# Patient Record
Sex: Female | Born: 1970 | Race: Black or African American | Hispanic: No | Marital: Single | Smoking: Never smoker
Health system: Southern US, Community
[De-identification: ages and names within clinical notes are randomized; demographics above are authoritative.]

## PROBLEM LIST (undated history)

## (undated) DIAGNOSIS — I509 Heart failure, unspecified: Secondary | ICD-10-CM

## (undated) DIAGNOSIS — G4733 Obstructive sleep apnea (adult) (pediatric): Secondary | ICD-10-CM

## (undated) DIAGNOSIS — I10 Essential (primary) hypertension: Secondary | ICD-10-CM

## (undated) DIAGNOSIS — I2510 Atherosclerotic heart disease of native coronary artery without angina pectoris: Secondary | ICD-10-CM

## (undated) DIAGNOSIS — E785 Hyperlipidemia, unspecified: Secondary | ICD-10-CM

## (undated) DIAGNOSIS — I5030 Unspecified diastolic (congestive) heart failure: Secondary | ICD-10-CM

## (undated) DIAGNOSIS — Z973 Presence of spectacles and contact lenses: Secondary | ICD-10-CM

## (undated) DIAGNOSIS — I700 Atherosclerosis of aorta: Secondary | ICD-10-CM

## (undated) HISTORY — DX: Hyperlipidemia, unspecified: E78.5

## (undated) HISTORY — DX: Essential (primary) hypertension: I10

## (undated) HISTORY — PX: BUNIONECTOMY: SHX129

---
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
## 2007-09-16 ENCOUNTER — Encounter: Admission: RE | Admit: 2007-09-16 | Discharge: 2007-09-16 | Payer: Self-pay | Admitting: Internal Medicine

## 2012-08-16 ENCOUNTER — Encounter: Payer: BC Managed Care – PPO | Admitting: Obstetrics and Gynecology

## 2013-08-20 DIAGNOSIS — E6609 Other obesity due to excess calories: Secondary | ICD-10-CM | POA: Insufficient documentation

## 2013-12-26 ENCOUNTER — Other Ambulatory Visit: Payer: Self-pay | Admitting: Otolaryngology

## 2013-12-26 DIAGNOSIS — H905 Unspecified sensorineural hearing loss: Secondary | ICD-10-CM

## 2013-12-26 DIAGNOSIS — H903 Sensorineural hearing loss, bilateral: Secondary | ICD-10-CM

## 2014-01-04 ENCOUNTER — Ambulatory Visit
Admission: RE | Admit: 2014-01-04 | Discharge: 2014-01-04 | Disposition: A | Discharge status: Home / Self Care | Payer: BC Managed Care – PPO | Source: Ambulatory Visit | Attending: Otolaryngology | Admitting: Otolaryngology

## 2014-01-04 DIAGNOSIS — H905 Unspecified sensorineural hearing loss: Secondary | ICD-10-CM

## 2014-01-04 DIAGNOSIS — H903 Sensorineural hearing loss, bilateral: Secondary | ICD-10-CM

## 2014-01-04 MED ORDER — GADOBENATE DIMEGLUMINE 529 MG/ML IV SOLN
20.0000 mL | Freq: Once | INTRAVENOUS | Status: AC | PRN
  Administered 2014-01-04: 20 mL via INTRAVENOUS

## 2014-04-15 DIAGNOSIS — M2010 Hallux valgus (acquired), unspecified foot: Secondary | ICD-10-CM

## 2014-04-15 DIAGNOSIS — M2040 Other hammer toe(s) (acquired), unspecified foot: Secondary | ICD-10-CM

## 2014-12-06 HISTORY — PX: BUNIONECTOMY: SHX129

## 2016-07-13 ENCOUNTER — Other Ambulatory Visit: Payer: Self-pay | Admitting: Gastroenterology

## 2016-07-13 DIAGNOSIS — R110 Nausea: Secondary | ICD-10-CM

## 2016-08-13 ENCOUNTER — Other Ambulatory Visit (HOSPITAL_COMMUNITY): Payer: BC Managed Care – PPO

## 2016-08-13 ENCOUNTER — Ambulatory Visit (HOSPITAL_COMMUNITY): Payer: BC Managed Care – PPO

## 2017-01-24 ENCOUNTER — Institutional Professional Consult (permissible substitution): Payer: BC Managed Care – PPO | Admitting: Neurology

## 2017-01-24 ENCOUNTER — Telehealth: Payer: Self-pay

## 2017-01-24 NOTE — Telephone Encounter (Signed)
Patient did not show to new sleep consult appt.  

## 2017-01-25 ENCOUNTER — Encounter: Payer: Self-pay | Admitting: Neurology

## 2019-08-16 ENCOUNTER — Ambulatory Visit: Payer: Self-pay | Admitting: Cardiology

## 2019-08-20 ENCOUNTER — Other Ambulatory Visit: Payer: Self-pay

## 2019-08-20 ENCOUNTER — Ambulatory Visit (INDEPENDENT_AMBULATORY_CARE_PROVIDER_SITE_OTHER): Payer: BC Managed Care – PPO | Admitting: Cardiology

## 2019-08-20 ENCOUNTER — Encounter: Payer: Self-pay | Admitting: Cardiology

## 2019-08-20 VITALS — BP 170/105 | HR 86 | Temp 98.0°F | Ht 66.0 in | Wt 276.0 lb

## 2019-08-20 DIAGNOSIS — R9431 Abnormal electrocardiogram [ECG] [EKG]: Secondary | ICD-10-CM

## 2019-08-20 DIAGNOSIS — I10 Essential (primary) hypertension: Secondary | ICD-10-CM

## 2019-08-20 NOTE — Progress Notes (Signed)
Patient referred by Jani Gravel, MD for hypertension  Subjective:   Jessica [name], female    DOB: 05-15-1971, 48 y.o.   MRN: [id number]   Chief Complaint  Patient presents with  . Hypertension  . New Patient (Initial Visit)    HPI  48 y.o. African-American female with hypertension, abnormal EKG.  Patient was recently scheduled to undergo screening colonoscopy.  However, this was canceled due to abnormal EKG. Patient is a Radio producer, currently teaching virtually.  She has had hypertension for 20 years.  This started after her pregnancy.  Ever since, her blood pressure has never been controlled.  She was recently increased on her antihypertensive medical regimen, currently taking carvedilol 12.5 mg twice daily, and spironolactone 25 mg once daily.  Up until 2 weeks ago, patient was walking regularly up to 3 miles in 45 minutes without any complaints of chest pain, shortness of breath.  She has noticed leg edema in the last few weeks.  She denies orthopnea, PND.  While she denies snoring, she does endorse daytime sleepiness at times.   Past Medical History:  Diagnosis Date  . Hyperlipidemia   . Hypertension      Past Surgical History:  Procedure Laterality Date  . BUNIONECTOMY Right 2016  . HAND SURGERY Left 1993     Social History   Socioeconomic History  . Marital status: Unknown    Spouse name: Not on file  . Number of children: Not on file  . Years of education: Not on file  . Highest education level: Not on file  Occupational History  . Not on file  Social Needs  . Financial resource strain: Not on file  . Food insecurity    Worry: Not on file    Inability: Not on file  . Transportation needs    Medical: Not on file    Non-medical: Not on file  Tobacco Use  . Smoking status: Not on file  Substance and Sexual Activity  . Alcohol use: Not on file  . Drug use: Not on file  . Sexual activity: Not on file  Lifestyle  . Physical activity    Days per  week: Not on file    Minutes per session: Not on file  . Stress: Not on file  Relationships  . Social Herbalist on phone: Not on file    Gets together: Not on file    Attends religious service: Not on file    Active member of club or organization: Not on file    Attends meetings of clubs or organizations: Not on file    Relationship status: Not on file  . Intimate partner violence    Fear of current or ex partner: Not on file    Emotionally abused: Not on file    Physically abused: Not on file    Forced sexual activity: Not on file  Other Topics Concern  . Not on file  Social History Narrative  . Not on file     Family History  Problem Relation Age of Onset  . Hypertension Mother   . Hypertension Father   . Colon cancer Brother      Current Outpatient Medications on File Prior to Visit  Medication Sig Dispense Refill  . carvedilol (COREG) 12.5 MG tablet Take 1 tablet by mouth 3 (three) times daily.    Marland Kitchen spironolactone (ALDACTONE) 25 MG tablet Take 2 tablets by mouth daily.    . Vitamin D, Ergocalciferol, (DRISDOL)  1.25 MG (50000 UT) CAPS capsule Take 1 capsule by mouth once a week.     No current facility-administered medications on file prior to visit.     Cardiovascular studies:  EKG 08/20/2019: Sinus rhythm 82 bpm.  Left atrial enlargement.  Left ventricular hypertrophy. Lateral T wave inversions. Consider ischemia.  EKG 07/17/2019:  Sinus rhythm 84 bpm.  Inferolateral T wave inversions, consider ischemia.  Recent labs: 06/28/2019: Glucose 90. BUN/Cr 20/0.8., eGFR 74. Na/K 141/4.4 H/H 15/48. MCV 91. Platelets 228.  Chol 158, TG 186, HDL 50, LDL 71 HbA1C 5.5%   Review of Systems  Constitution: Negative for decreased appetite, malaise/fatigue, weight gain and weight loss.  HENT: Negative for congestion.   Eyes: Negative for visual disturbance.  Cardiovascular: Positive for leg swelling. Negative for chest pain, dyspnea on exertion, palpitations  and syncope.  Respiratory: Negative for cough.   Endocrine: Negative for cold intolerance.  Hematologic/Lymphatic: Does not bruise/bleed easily.  Skin: Negative for itching and rash.  Musculoskeletal: Negative for myalgias.  Gastrointestinal: Negative for abdominal pain, nausea and vomiting.  Genitourinary: Negative for dysuria.  Neurological: Negative for dizziness and weakness.  Psychiatric/Behavioral: The patient is not nervous/anxious.   All other systems reviewed and are negative.        Vitals:   08/20/19 1522  BP: (!) 182/108  Pulse: 84  Temp: 98 F (36.7 C)  SpO2: 97%     Body mass index is 44.55 kg/m. Filed Weights   08/20/19 1522  Weight: 276 lb (125.2 kg)     Objective:   Physical Exam  Constitutional: She is oriented to person, place, and time. She appears well-developed and well-nourished. No distress.  Morbid obesity  HENT:  Head: Normocephalic and atraumatic.  Eyes: Pupils are equal, round, and reactive to light. Conjunctivae are normal.  Neck: No JVD present.  Cardiovascular: Normal rate, regular rhythm and intact distal pulses.  No murmur heard. Pulmonary/Chest: Effort normal and breath sounds normal. She has no wheezes. She has no rales.  Abdominal: Soft. Bowel sounds are normal. There is no rebound.  Musculoskeletal:        General: Edema (1+ bilateral) present.  Lymphadenopathy:    She has no cervical adenopathy.  Neurological: She is alert and oriented to person, place, and time. No cranial nerve deficit.  Skin: Skin is warm and dry.  Psychiatric: She has a normal mood and affect.  Nursing note and vitals reviewed.         Assessment & Recommendations:   48 y.o. African-American female with hypertension, abnormal EKG.  Abnormal EKG: Likely hypertensive changes.  No symptoms of angina.  Hypertension: Uncontrolled.  Given onset at around age 89, secondary hypertension cannot be excluded.  At this time, recommend echocardiogram and  renal artery duplex ultrasound.  Increase carvedilol to 12.5 mg 3 times daily and spironolactone to 50 mg once daily.  If no cause identified on above work-up, may need further work-up for secondary hypertension including TSH, reading, angiotensin, to begin with.  Consider sleep study.  Patient has follow-up with PCP in 2 days.  Cardiac stratification: As long as blood pressure gets reasonably well controlled and echocardiogram does not show any profound abnormalities, she may be okay to proceed with screening colonoscopy.  Thank you for referring the patient to [place]. Please feel free to contact with any questions.  Nigel Mormon, MD [facility] Pager: [phone] Office: [phone] If no answer Cell [phone]

## 2019-08-21 NOTE — Addendum Note (Signed)
Addended by: PATWARDHAN, MANISH J on: 08/21/2019 12:04 PM   Modules accepted: Orders  

## 2019-09-04 ENCOUNTER — Other Ambulatory Visit: Payer: Self-pay

## 2019-09-04 ENCOUNTER — Ambulatory Visit (INDEPENDENT_AMBULATORY_CARE_PROVIDER_SITE_OTHER): Payer: BC Managed Care – PPO

## 2019-09-04 ENCOUNTER — Ambulatory Visit: Payer: BC Managed Care – PPO

## 2019-09-04 DIAGNOSIS — I10 Essential (primary) hypertension: Secondary | ICD-10-CM

## 2019-09-04 NOTE — Progress Notes (Signed)
Patient referred by Jani Gravel, MD for hypertension  Subjective:   Jessica [name], female    DOB: 1971/11/13, 48 y.o.   MRN: [id number]   Chief Complaint  Patient presents with  . Hypertension  . Follow-up    3-4  . Results    echo and renal duplex    HPI  48 y.o. African-American female with hypertension.  Blood pressure has improved, but still remains suboptimal. She has been taking coreg 12.5 twice day, unlike three times day recommended by me at last visit. Leg edema has improved.    Past Medical History:  Diagnosis Date  . Hyperlipidemia   . Hypertension      Past Surgical History:  Procedure Laterality Date  . BUNIONECTOMY Right 2016  . HAND SURGERY Left 1993     Social History   Socioeconomic History  . Marital status: Single    Spouse name: Not on file  . Number of children: 1  . Years of education: Not on file  . Highest education level: Not on file  Occupational History  . Not on file  Social Needs  . Financial resource strain: Not on file  . Food insecurity    Worry: Not on file    Inability: Not on file  . Transportation needs    Medical: Not on file    Non-medical: Not on file  Tobacco Use  . Smoking status: Never Smoker  . Smokeless tobacco: Never Used  Substance and Sexual Activity  . Alcohol use: Not Currently  . Drug use: Not Currently  . Sexual activity: Not on file  Lifestyle  . Physical activity    Days per week: Not on file    Minutes per session: Not on file  . Stress: Not on file  Relationships  . Social Herbalist on phone: Not on file    Gets together: Not on file    Attends religious service: Not on file    Active member of club or organization: Not on file    Attends meetings of clubs or organizations: Not on file    Relationship status: Not on file  . Intimate partner violence    Fear of current or ex partner: Not on file    Emotionally abused: Not on file    Physically abused: Not on file   Forced sexual activity: Not on file  Other Topics Concern  . Not on file  Social History Narrative  . Not on file     Family History  Problem Relation Age of Onset  . Hypertension Mother   . Hypertension Father   . Colon cancer Brother      Current Outpatient Medications on File Prior to Visit  Medication Sig Dispense Refill  . carvedilol (COREG) 12.5 MG tablet Take 1 tablet by mouth 3 (three) times daily.    Marland Kitchen spironolactone (ALDACTONE) 25 MG tablet Take 2 tablets by mouth daily.    . Vitamin D, Ergocalciferol, (DRISDOL) 1.25 MG (50000 UT) CAPS capsule Take 1 capsule by mouth once a week.     No current facility-administered medications on file prior to visit.     Cardiovascular studies:  Renal artery duplex  09/04/2019: No evidence of renal artery occlusive disease in either renal artery. Normal velocity in bilateral renal arteries.  Normal intrarenal vascular perfusion is noted in both kidneys. Normal kidney size bilaterally.  Normal study.   Echocardiogram 09/04/2019:  Normal LV systolic function with EF 55%. Left  ventricle cavity is normal in size. Moderate concentric hypertrophy of the left ventricle. Normal global wall motion. Doppler evidence of grade II (pseudonormal) diastolic dysfunction. Diastolic dysfunction do not suggest elevated LA/LV endiastolic pressure. Left atrial cavity is mildly dilated at 4.1 cm. IVC is dilated with a respiratory response of <50%. This may suggest elevated right heart pressure  EKG 08/20/2019: Sinus rhythm 82 bpm.  Left atrial enlargement.  Left ventricular hypertrophy. Lateral T wave inversions. Consider ischemia.  EKG 07/17/2019:  Sinus rhythm 84 bpm.  Inferolateral T wave inversions, consider ischemia.  Recent labs: 06/28/2019: Glucose 90. BUN/Cr 20/0.8., eGFR 74. Na/K 141/4.4 H/H 15/48. MCV 91. Platelets 228.  Chol 158, TG 186, HDL 50, LDL 71 HbA1C 5.5%   Review of Systems  Constitution: Negative for decreased appetite,  malaise/fatigue, weight gain and weight loss.  HENT: Negative for congestion.   Eyes: Negative for visual disturbance.  Cardiovascular: Positive for leg swelling. Negative for chest pain, dyspnea on exertion, palpitations and syncope.  Respiratory: Negative for cough.   Endocrine: Negative for cold intolerance.  Hematologic/Lymphatic: Does not bruise/bleed easily.  Skin: Negative for itching and rash.  Musculoskeletal: Negative for myalgias.  Gastrointestinal: Negative for abdominal pain, nausea and vomiting.  Genitourinary: Negative for dysuria.  Neurological: Negative for dizziness and weakness.  Psychiatric/Behavioral: The patient is not nervous/anxious.   All other systems reviewed and are negative.        Vitals:   09/10/19 1512  BP: (!) 169/98  Pulse: 84  Temp: 98 F (36.7 C)  SpO2: 100%     Body mass index is 44.39 kg/m. Filed Weights   09/10/19 1512  Weight: 275 lb (124.7 kg)     Objective:   Physical Exam  Constitutional: She is oriented to person, place, and time. She appears well-developed and well-nourished. No distress.  Morbid obesity  HENT:  Head: Normocephalic and atraumatic.  Eyes: Pupils are equal, round, and reactive to light. Conjunctivae are normal.  Neck: No JVD present.  Cardiovascular: Normal rate, regular rhythm and intact distal pulses.  No murmur heard. Pulmonary/Chest: Effort normal and breath sounds normal. She has no wheezes. She has no rales.  Abdominal: Soft. Bowel sounds are normal. There is no rebound.  Musculoskeletal:        General: Edema (Trace bilateral) present.  Lymphadenopathy:    She has no cervical adenopathy.  Neurological: She is alert and oriented to person, place, and time. No cranial nerve deficit.  Skin: Skin is warm and dry.  Psychiatric: She has a normal mood and affect.  Nursing note and vitals reviewed.         Assessment & Recommendations:   48 y.o. African-American female with hypertension.   Hypertension: Uncontrolled. Increase coreg to 25 mg bid. Continue Spironolactone 50 mg daily. No renal artery stenosis on renal artery duplex. Will add further secondary hypertension workup, if BP remains uncontrolled on three agents.   F/u in 4 weeks. Will add Bidil at that tim, if necessary. Okay to proceed with colonoscopy.    Nigel Mormon, MD [facility] Pager: [phone] Office: [phone] If no answer Cell [phone]

## 2019-09-10 ENCOUNTER — Other Ambulatory Visit: Payer: Self-pay

## 2019-09-10 ENCOUNTER — Encounter: Payer: Self-pay | Admitting: Cardiology

## 2019-09-10 ENCOUNTER — Ambulatory Visit: Payer: BC Managed Care – PPO | Admitting: Cardiology

## 2019-09-10 VITALS — BP 169/98 | HR 84 | Temp 98.0°F | Ht 66.0 in | Wt 275.0 lb

## 2019-09-10 DIAGNOSIS — I10 Essential (primary) hypertension: Secondary | ICD-10-CM

## 2019-09-10 MED ORDER — CARVEDILOL 25 MG PO TABS: 25.0000 mg | ORAL_TABLET | Freq: Two times a day (BID) | ORAL | 2 refills | Status: DC

## 2019-10-08 ENCOUNTER — Encounter: Payer: Self-pay | Admitting: Cardiology

## 2019-10-08 ENCOUNTER — Other Ambulatory Visit: Payer: Self-pay

## 2019-10-08 ENCOUNTER — Ambulatory Visit: Payer: BC Managed Care – PPO | Admitting: Cardiology

## 2019-10-08 VITALS — BP 173/102 | HR 86 | Temp 97.3°F | Ht 66.0 in | Wt 275.8 lb

## 2019-10-08 DIAGNOSIS — I10 Essential (primary) hypertension: Secondary | ICD-10-CM

## 2019-10-08 DIAGNOSIS — R0683 Snoring: Secondary | ICD-10-CM

## 2019-10-08 MED ORDER — BIDIL 20-37.5 MG PO TABS: 1.0000 | ORAL_TABLET | Freq: Three times a day (TID) | ORAL | 3 refills | Status: DC

## 2019-10-08 NOTE — Progress Notes (Signed)
Patient referred by Jani Gravel, MD for hypertension  Subjective:   Jessica [name], female    DOB: Sep 03, 1971, 48 y.o.   MRN: [id number]   Chief Complaint  Patient presents with  . Hypertension  . Follow-up    HPI  48 y.o. African-American female with hypertension.  No renal artery stenosis was seen on renal artery duplex. At last visit, I increased coreg to 25 mg bid, continued Spironolactone 50 mg daily.  Blood pressure remains uncontrolled.   Past Medical History:  Diagnosis Date  . Hyperlipidemia   . Hypertension      Past Surgical History:  Procedure Laterality Date  . BUNIONECTOMY Right 2016  . HAND SURGERY Left 1993     Social History   Socioeconomic History  . Marital status: Single    Spouse name: Not on file  . Number of children: 1  . Years of education: Not on file  . Highest education level: Not on file  Occupational History  . Not on file  Social Needs  . Financial resource strain: Not on file  . Food insecurity    Worry: Not on file    Inability: Not on file  . Transportation needs    Medical: Not on file    Non-medical: Not on file  Tobacco Use  . Smoking status: Never Smoker  . Smokeless tobacco: Never Used  Substance and Sexual Activity  . Alcohol use: Not Currently  . Drug use: Not Currently  . Sexual activity: Not on file  Lifestyle  . Physical activity    Days per week: Not on file    Minutes per session: Not on file  . Stress: Not on file  Relationships  . Social Herbalist on phone: Not on file    Gets together: Not on file    Attends religious service: Not on file    Active member of club or organization: Not on file    Attends meetings of clubs or organizations: Not on file    Relationship status: Not on file  . Intimate partner violence    Fear of current or ex partner: Not on file    Emotionally abused: Not on file    Physically abused: Not on file    Forced sexual activity: Not on file  Other  Topics Concern  . Not on file  Social History Narrative  . Not on file     Family History  Problem Relation Age of Onset  . Hypertension Mother   . Hypertension Father   . Colon cancer Brother      Current Outpatient Medications on File Prior to Visit  Medication Sig Dispense Refill  . carvedilol (COREG) 12.5 MG tablet Take 12.5 mg by mouth 2 (two) times daily with a meal.    . spironolactone (ALDACTONE) 50 MG tablet Take 50 mg by mouth daily.    . Vitamin D, Ergocalciferol, (DRISDOL) 1.25 MG (50000 UT) CAPS capsule Take 1 capsule by mouth once a week.     No current facility-administered medications on file prior to visit.     Cardiovascular studies:  Renal artery duplex  09/04/2019: No evidence of renal artery occlusive disease in either renal artery. Normal velocity in bilateral renal arteries.  Normal intrarenal vascular perfusion is noted in both kidneys. Normal kidney size bilaterally.  Normal study.   Echocardiogram 09/04/2019:  Normal LV systolic function with EF 55%. Left ventricle cavity is normal in size. Moderate concentric hypertrophy of  the left ventricle. Normal global wall motion. Doppler evidence of grade II (pseudonormal) diastolic dysfunction. Diastolic dysfunction do not suggest elevated LA/LV endiastolic pressure. Left atrial cavity is mildly dilated at 4.1 cm. IVC is dilated with a respiratory response of <50%. This may suggest elevated right heart pressure  EKG 08/20/2019: Sinus rhythm 82 bpm.  Left atrial enlargement.  Left ventricular hypertrophy. Lateral T wave inversions. Consider ischemia.  EKG 07/17/2019:  Sinus rhythm 84 bpm.  Inferolateral T wave inversions, consider ischemia.  Recent labs: 06/28/2019: Glucose 90. BUN/Cr 20/0.8., eGFR 74. Na/K 141/4.4 H/H 15/48. MCV 91. Platelets 228.  Chol 158, TG 186, HDL 50, LDL 71 HbA1C 5.5%   Review of Systems  Constitution: Negative for decreased appetite, malaise/fatigue, weight gain and weight  loss.  HENT: Negative for congestion.   Eyes: Negative for visual disturbance.  Cardiovascular: Positive for leg swelling. Negative for chest pain, dyspnea on exertion, palpitations and syncope.  Respiratory: Negative for cough.   Endocrine: Negative for cold intolerance.  Hematologic/Lymphatic: Does not bruise/bleed easily.  Skin: Negative for itching and rash.  Musculoskeletal: Negative for myalgias.  Gastrointestinal: Negative for abdominal pain, nausea and vomiting.  Genitourinary: Negative for dysuria.  Neurological: Negative for dizziness and weakness.  Psychiatric/Behavioral: The patient is not nervous/anxious.   All other systems reviewed and are negative.        Vitals:   10/08/19 1435 10/08/19 1443  BP: (!) 173/103 (!) 173/102  Pulse: 87 86  Temp: (!) 97.3 F (36.3 C)   SpO2: 96%      Body mass index is 44.52 kg/m. Filed Weights   10/08/19 1435  Weight: 275 lb 12.8 oz (125.1 kg)     Objective:   Physical Exam  Constitutional: She is oriented to person, place, and time. She appears well-developed and well-nourished. No distress.  Morbid obesity  HENT:  Head: Normocephalic and atraumatic.  Eyes: Pupils are equal, round, and reactive to light. Conjunctivae are normal.  Neck: No JVD present.  Cardiovascular: Normal rate, regular rhythm and intact distal pulses.  No murmur heard. Pulmonary/Chest: Effort normal and breath sounds normal. She has no wheezes. She has no rales.  Abdominal: Soft. Bowel sounds are normal. There is no rebound.  Musculoskeletal:        General: Edema (Trace bilateral) present.  Lymphadenopathy:    She has no cervical adenopathy.  Neurological: She is alert and oriented to person, place, and time. No cranial nerve deficit.  Skin: Skin is warm and dry.  Psychiatric: She has a normal mood and affect.  Nursing note and vitals reviewed.         Assessment & Recommendations:   48 y.o. African-American female with hypertension.   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 Continue Spironolactone 50 mg daily. No renal artery stenosis on renal artery duplex. Added Bidil 20-37.5 mg tid. If no improvement, will add chlorthalidone next. Referred to sleep study.  F/u in 4 weeks.   Nigel Mormon, MD [facility] Pager: [phone] Office: [phone] If no answer Cell [phone]

## 2019-11-12 ENCOUNTER — Other Ambulatory Visit: Payer: Self-pay

## 2019-11-12 ENCOUNTER — Encounter: Payer: Self-pay | Admitting: Cardiology

## 2019-11-12 ENCOUNTER — Ambulatory Visit (INDEPENDENT_AMBULATORY_CARE_PROVIDER_SITE_OTHER): Payer: BC Managed Care – PPO | Admitting: Cardiology

## 2019-11-12 VITALS — BP 195/112 | HR 96 | Temp 94.5°F | Ht 66.0 in | Wt 275.0 lb

## 2019-11-12 DIAGNOSIS — R0683 Snoring: Secondary | ICD-10-CM

## 2019-11-12 DIAGNOSIS — I10 Essential (primary) hypertension: Secondary | ICD-10-CM

## 2019-11-12 MED ORDER — CARVEDILOL 12.5 MG PO TABS
25.0000 mg | ORAL_TABLET | Freq: Two times a day (BID) | ORAL | 3 refills | Status: DC
Start: 2019-11-12 — End: 2021-07-12

## 2019-11-12 MED ORDER — BIDIL 20-37.5 MG PO TABS
2.0000 | ORAL_TABLET | Freq: Three times a day (TID) | ORAL | 3 refills | Status: DC
Start: 2019-11-12 — End: 2021-07-16

## 2019-11-12 NOTE — Progress Notes (Signed)
Patient referred by Jani Gravel, MD for hypertension  Subjective:   Jessica [name], female    DOB: 01-Mar-1971, 48 y.o.   MRN: [id number]  Chief Complaint  Patient presents with  . Hypertension  . Follow-up    4 week     HPI  48 y.o. African-American female with hypertension.  No renal artery stenosis was seen on renal artery duplex. At last visit, I added Bidil 20-37.5 mg tid, continued coreg to 25 mg bid, and Spironolactone 50 mg daily.  Blood pressure remains uncontrolled. She denies any symptoms.  Past Medical History:  Diagnosis Date  . Hyperlipidemia   . Hypertension      Past Surgical History:  Procedure Laterality Date  . BUNIONECTOMY Right 2016  . HAND SURGERY Left 1993     Social History   Socioeconomic History  . Marital status: Single    Spouse name: Not on file  . Number of children: 1  . Years of education: Not on file  . Highest education level: Not on file  Occupational History  . Not on file  Social Needs  . Financial resource strain: Not on file  . Food insecurity    Worry: Not on file    Inability: Not on file  . Transportation needs    Medical: Not on file    Non-medical: Not on file  Tobacco Use  . Smoking status: Never Smoker  . Smokeless tobacco: Never Used  Substance and Sexual Activity  . Alcohol use: Not Currently  . Drug use: Not Currently  . Sexual activity: Not on file  Lifestyle  . Physical activity    Days per week: Not on file    Minutes per session: Not on file  . Stress: Not on file  Relationships  . Social Herbalist on phone: Not on file    Gets together: Not on file    Attends religious service: Not on file    Active member of club or organization: Not on file    Attends meetings of clubs or organizations: Not on file    Relationship status: Not on file  . Intimate partner violence    Fear of current or ex partner: Not on file    Emotionally abused: Not on file    Physically abused: Not on  file    Forced sexual activity: Not on file  Other Topics Concern  . Not on file  Social History Narrative  . Not on file     Family History  Problem Relation Age of Onset  . Hypertension Mother   . Hypertension Father   . Colon cancer Brother      Current Outpatient Medications on File Prior to Visit  Medication Sig Dispense Refill  . carvedilol (COREG) 12.5 MG tablet Take 12.5 mg by mouth 2 (two) times daily with a meal.    . isosorbide-hydrALAZINE (BIDIL) 20-37.5 MG tablet Take 1 tablet by mouth 3 (three) times daily. 90 tablet 3  . spironolactone (ALDACTONE) 50 MG tablet Take 50 mg by mouth daily.    . Vitamin D, Ergocalciferol, (DRISDOL) 1.25 MG (50000 UT) CAPS capsule Take 1 capsule by mouth once a week.     No current facility-administered medications on file prior to visit.     Cardiovascular studies:  Renal artery duplex  09/04/2019: No evidence of renal artery occlusive disease in either renal artery. Normal velocity in bilateral renal arteries.  Normal intrarenal vascular perfusion is noted in  both kidneys. Normal kidney size bilaterally.  Normal study.   Echocardiogram 09/04/2019:  Normal LV systolic function with EF 55%. Left ventricle cavity is normal in size. Moderate concentric hypertrophy of the left ventricle. Normal global wall motion. Doppler evidence of grade II (pseudonormal) diastolic dysfunction. Diastolic dysfunction do not suggest elevated LA/LV endiastolic pressure. Left atrial cavity is mildly dilated at 4.1 cm. IVC is dilated with a respiratory response of <50%. This may suggest elevated right heart pressure  EKG 08/20/2019: Sinus rhythm 82 bpm.  Left atrial enlargement.  Left ventricular hypertrophy. Lateral T wave inversions. Consider ischemia.  EKG 07/17/2019:  Sinus rhythm 84 bpm.  Inferolateral T wave inversions, consider ischemia.  Recent labs: 06/28/2019: Glucose 90. BUN/Cr 20/0.8., eGFR 74. Na/K 141/4.4 H/H 15/48. MCV 91. Platelets  228.  Chol 158, TG 186, HDL 50, LDL 71 HbA1C 5.5%   Review of Systems  Constitution: Negative for decreased appetite, malaise/fatigue, weight gain and weight loss.  HENT: Negative for congestion.   Eyes: Negative for visual disturbance.  Cardiovascular: Positive for leg swelling. Negative for chest pain, dyspnea on exertion, palpitations and syncope.  Respiratory: Negative for cough.   Endocrine: Negative for cold intolerance.  Hematologic/Lymphatic: Does not bruise/bleed easily.  Skin: Negative for itching and rash.  Musculoskeletal: Negative for myalgias.  Gastrointestinal: Negative for abdominal pain, nausea and vomiting.  Genitourinary: Negative for dysuria.  Neurological: Negative for dizziness and weakness.  Psychiatric/Behavioral: The patient is not nervous/anxious.   All other systems reviewed and are negative.        Vitals:   11/12/19 1457 11/12/19 1502  BP: (!) 208/139 (!) 190/105  Pulse: 100 100  Temp: (!) 94.5 F (34.7 C)   SpO2: 97%      Body mass index is 44.39 kg/m. Filed Weights   11/12/19 1457  Weight: 275 lb (124.7 kg)     Objective:   Physical Exam  Constitutional: She is oriented to person, place, and time. She appears well-developed and well-nourished. No distress.  Morbid obesity  HENT:  Head: Normocephalic and atraumatic.  Eyes: Pupils are equal, round, and reactive to light. Conjunctivae are normal.  Neck: No JVD present.  Cardiovascular: Normal rate, regular rhythm and intact distal pulses.  No murmur heard. Pulmonary/Chest: Effort normal and breath sounds normal. She has no wheezes. She has no rales.  Abdominal: Soft. Bowel sounds are normal. There is no rebound.  Musculoskeletal:        General: Edema (Trace bilateral) present.  Lymphadenopathy:    She has no cervical adenopathy.  Neurological: She is alert and oriented to person, place, and time. No cranial nerve deficit.  Skin: Skin is warm and dry.  Psychiatric: She has a  normal mood and affect.  Nursing note and vitals reviewed.         Assessment & Recommendations:   48 y.o. African-American female with hypertension.  Resistant hypertension: No renal artery stenosis on renal artery duplex. Conitnue coreg to 25 mg bid. Continue Spironolactone 50 mg daily. Increased Bidil to 2 tabs of 20-37.5 mg tid. If no improvement, will add losartan-hctz or lisinopril-hctz. Sleep study is pending.   F/u in 4 weeks.   Nigel Mormon, MD [facility] Pager: [phone] Office: [phone] If no answer Cell [phone]

## 2019-11-25 NOTE — Progress Notes (Signed)
Patient referred by Jani Gravel, MD for hypertension  Subjective:   Jessica [name], female    DOB: 1971-07-19, 48 y.o.   MRN: [id number]  Chief Complaint  Patient presents with  . Hypertension  . Follow-up     HPI  48 y.o. African-American female with hypertension.  No renal artery stenosis was seen on renal artery duplex. At last visit, I Increased Bidil to 2 tabs of 20-37.5 mg tid. Sleep study is pending. Blood pressure remains uncontrolled, but marginally improved.   Past Medical History:  Diagnosis Date  . Hyperlipidemia   . Hypertension      Past Surgical History:  Procedure Laterality Date  . BUNIONECTOMY Right 2016  . HAND SURGERY Left 1993     Social History   Socioeconomic History  . Marital status: Single    Spouse name: Not on file  . Number of children: 1  . Years of education: Not on file  . Highest education level: Not on file  Occupational History  . Not on file  Tobacco Use  . Smoking status: Never Smoker  . Smokeless tobacco: Never Used  Substance and Sexual Activity  . Alcohol use: Not Currently  . Drug use: Not Currently  . Sexual activity: Not on file  Other Topics Concern  . Not on file  Social History Narrative  . Not on file   Social Determinants of Health   Financial Resource Strain:   . Difficulty of Paying Living Expenses: Not on file  Food Insecurity:   . Worried About Charity fundraiser in the Last Year: Not on file  . Ran Out of Food in the Last Year: Not on file  Transportation Needs:   . Lack of Transportation (Medical): Not on file  . Lack of Transportation (Non-Medical): Not on file  Physical Activity:   . Days of Exercise per Week: Not on file  . Minutes of Exercise per Session: Not on file  Stress:   . Feeling of Stress : Not on file  Social Connections:   . Frequency of Communication with Friends and Family: Not on file  . Frequency of Social Gatherings with Friends and Family: Not on file  . Attends  Religious Services: Not on file  . Active Member of Clubs or Organizations: Not on file  . Attends Archivist Meetings: Not on file  . Marital Status: Not on file  Intimate Partner Violence:   . Fear of Current or Ex-Partner: Not on file  . Emotionally Abused: Not on file  . Physically Abused: Not on file  . Sexually Abused: Not on file     Family History  Problem Relation Age of Onset  . Hypertension Mother   . Hypertension Father   . Heart disease Sister   . Colon cancer Brother      Current Outpatient Medications on File Prior to Visit  Medication Sig Dispense Refill  . carvedilol (COREG) 12.5 MG tablet Take 2 tablets (25 mg total) by mouth 2 (two) times daily with a meal. 60 tablet 3  . isosorbide-hydrALAZINE (BIDIL) 20-37.5 MG tablet Take 2 tablets by mouth 3 (three) times daily. 180 tablet 3  . spironolactone (ALDACTONE) 50 MG tablet Take 50 mg by mouth daily.    . Vitamin D, Ergocalciferol, (DRISDOL) 1.25 MG (50000 UT) CAPS capsule Take 1 capsule by mouth once a week.     No current facility-administered medications on file prior to visit.    Cardiovascular studies:  Renal artery duplex  09/04/2019: No evidence of renal artery occlusive disease in either renal artery. Normal velocity in bilateral renal arteries.  Normal intrarenal vascular perfusion is noted in both kidneys. Normal kidney size bilaterally.  Normal study.   Echocardiogram 09/04/2019:  Normal LV systolic function with EF 55%. Left ventricle cavity is normal in size. Moderate concentric hypertrophy of the left ventricle. Normal global wall motion. Doppler evidence of grade II (pseudonormal) diastolic dysfunction. Diastolic dysfunction do not suggest elevated LA/LV endiastolic pressure. Left atrial cavity is mildly dilated at 4.1 cm. IVC is dilated with a respiratory response of <50%. This may suggest elevated right heart pressure  EKG 08/20/2019: Sinus rhythm 82 bpm.  Left atrial enlargement.   Left ventricular hypertrophy. Lateral T wave inversions. Consider ischemia.  EKG 07/17/2019:  Sinus rhythm 84 bpm.  Inferolateral T wave inversions, consider ischemia.  Recent labs: 06/28/2019: Glucose 90. BUN/Cr 20/0.8., eGFR 74. Na/K 141/4.4 H/H 15/48. MCV 91. Platelets 228.  Chol 158, TG 186, HDL 50, LDL 71 HbA1C 5.5%   Review of Systems  Constitution: Negative for decreased appetite, malaise/fatigue, weight gain and weight loss.  HENT: Negative for congestion.   Eyes: Negative for visual disturbance.  Cardiovascular: Positive for leg swelling. Negative for chest pain, dyspnea on exertion, palpitations and syncope.  Respiratory: Negative for cough.   Endocrine: Negative for cold intolerance.  Hematologic/Lymphatic: Does not bruise/bleed easily.  Skin: Negative for itching and rash.  Musculoskeletal: Negative for myalgias.  Gastrointestinal: Negative for abdominal pain, nausea and vomiting.  Genitourinary: Negative for dysuria.  Neurological: Negative for dizziness and weakness.  Psychiatric/Behavioral: The patient is not nervous/anxious.   All other systems reviewed and are negative.        Vitals:   11/26/19 1402  BP: (!) 176/110  Pulse: 98  Temp: 98.1 F (36.7 C)  SpO2: 97%     Body mass index is 44.71 kg/m. Filed Weights   11/26/19 1402  Weight: 277 lb (125.6 kg)     Objective:   Physical Exam  Constitutional: She is oriented to person, place, and time. She appears well-developed and well-nourished. No distress.  Morbid obesity  HENT:  Head: Normocephalic and atraumatic.  Eyes: Pupils are equal, round, and reactive to light. Conjunctivae are normal.  Neck: No JVD present.  Cardiovascular: Normal rate, regular rhythm and intact distal pulses.  No murmur heard. Pulmonary/Chest: Effort normal and breath sounds normal. She has no wheezes. She has no rales.  Abdominal: Soft. Bowel sounds are normal. There is no rebound.  Musculoskeletal:         General: Edema (1+ b/l) present.  Lymphadenopathy:    She has no cervical adenopathy.  Neurological: She is alert and oriented to person, place, and time. No cranial nerve deficit.  Skin: Skin is warm and dry.  Psychiatric: She has a normal mood and affect.  Nursing note and vitals reviewed.         Assessment & Recommendations:   48 y.o. African-American female with hypertension.  Resistant hypertension: No renal artery stenosis on renal artery duplex. Conitnue coreg to 25 mg bid. Continue Bidil to 2 tabs of 20-37.5 mg tid. Increased Spironolactone to 100 mg daiy. Sleep study is pending.   BMP in 1 week. F/u in 2-3 weeks.  Nigel Mormon, MD [facility] Pager: [phone] Office: [phone] If no answer Cell [phone]

## 2019-11-26 ENCOUNTER — Ambulatory Visit: Payer: BC Managed Care – PPO | Admitting: Cardiology

## 2019-11-26 ENCOUNTER — Other Ambulatory Visit: Payer: Self-pay

## 2019-11-26 ENCOUNTER — Encounter: Payer: Self-pay | Admitting: Cardiology

## 2019-11-26 VITALS — BP 176/110 | HR 98 | Temp 98.1°F | Ht 66.0 in | Wt 277.0 lb

## 2019-11-26 DIAGNOSIS — I10 Essential (primary) hypertension: Secondary | ICD-10-CM

## 2019-11-26 DIAGNOSIS — R0683 Snoring: Secondary | ICD-10-CM | POA: Diagnosis not present

## 2019-11-26 MED ORDER — SPIRONOLACTONE 100 MG PO TABS: 50.0000 mg | ORAL_TABLET | Freq: Every day | ORAL | 3 refills | Status: DC

## 2019-11-27 ENCOUNTER — Ambulatory Visit: Payer: BC Managed Care – PPO | Admitting: Neurology

## 2019-11-27 ENCOUNTER — Encounter: Payer: Self-pay | Admitting: Neurology

## 2019-11-27 VITALS — BP 180/105 | HR 96 | Temp 97.2°F | Ht 65.0 in | Wt 278.0 lb

## 2019-11-27 DIAGNOSIS — R0683 Snoring: Secondary | ICD-10-CM | POA: Diagnosis not present

## 2019-11-27 DIAGNOSIS — R030 Elevated blood-pressure reading, without diagnosis of hypertension: Secondary | ICD-10-CM | POA: Diagnosis not present

## 2019-11-27 DIAGNOSIS — R351 Nocturia: Secondary | ICD-10-CM

## 2019-11-27 DIAGNOSIS — Z9189 Other specified personal risk factors, not elsewhere classified: Secondary | ICD-10-CM

## 2019-11-27 DIAGNOSIS — R519 Headache, unspecified: Secondary | ICD-10-CM

## 2019-11-27 DIAGNOSIS — Z6841 Body Mass Index (BMI) 40.0 and over, adult: Secondary | ICD-10-CM

## 2019-11-27 NOTE — Patient Instructions (Signed)

## 2019-11-27 NOTE — Progress Notes (Signed)
Subjective:    Patient ID: Jessica [name] is a 48 y.o. female.  HPI     Huston Foley, MD, PhD [facility] [address], [address] [address] [place], [place] [address]  Dear Dr. Rosemary Holms, I saw your patient, Jessica [name], upon your kind request in my sleep clinic today for initial consultation of her sleep disorder, in particular, concern for underlying obstructive sleep apnea.  The patient is unaccompanied today.  As you know, Jessica [name] is a 48 year old right-handed woman with an underlying medical history of hypertension, hyperlipidemia, vitamin D deficiency and morbid obesity with a BMI of over 45, who reports snoring and excessive daytime somnolence.  She has had difficult to control blood pressure.  She had a recent increase in her BiDil. I reviewed your office note from 11/26/2019.  She has had difficult to control blood pressure despite being on several medications.  Her Epworth sleepiness score is 3 out of 24, fatigue severity score is 33 out of 63.  Her blood pressure is elevated today.  She reports that she has not taken her morning blood pressure meds yet.  She denies any chest pain, shortness of breath, blurry vision or headache.  She sometimes wakes up with a headache which is dull, achy, bifrontal and brief.  She does not have a history of migraines.  She has nocturia about once per average night.  She is not aware of any family history of OSA.  She lives with her 61 year old daughter, her daughter has reported her snoring to her.  She denies any symptoms of gasping for air but has woken herself up with a snort or her own snoring.  She is a non-smoker, does not currently utilize any alcohol, does not drink caffeine daily.  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  Bedtime is generally between 9 and 10 and rise time around she has no pets in the household.  She has a TV in the bedroom but does not actually use it.   Her Past Medical History Is Significant For: Past  Medical History:  Diagnosis Date  . Hyperlipidemia   . Hypertension     Her Past Surgical History Is Significant For: Past Surgical History:  Procedure Laterality Date  . BUNIONECTOMY Right 2016  . HAND SURGERY Left 1993    Her Family History Is Significant For: Family History  Problem Relation Age of Onset  . Hypertension Mother   . Hypertension Father   . Heart disease Sister   . Colon cancer Brother     Her Social History Is Significant For: Social History   Socioeconomic History  . Marital status: Single    Spouse name: Not on file  . Number of children: 1  . Years of education: Not on file  . Highest education level: Not on file  Occupational History  . Not on file  Tobacco Use  . Smoking status: Never Smoker  . Smokeless tobacco: Never Used  Substance and Sexual Activity  . Alcohol use: Not Currently  . Drug use: Not Currently  . Sexual activity: Not on file  Other Topics Concern  . Not on file  Social History Narrative  . Not on file   Social Determinants of Health   Financial Resource Strain:   . Difficulty of Paying Living Expenses: Not on file  Food Insecurity:   . Worried About Programme researcher, broadcasting/film/video in the Last Year: Not on file  . Ran Out of Food in the Last  Year: Not on file  Transportation Needs:   . Lack of Transportation (Medical): Not on file  . Lack of Transportation (Non-Medical): Not on file  Physical Activity:   . Days of Exercise per Week: Not on file  . Minutes of Exercise per Session: Not on file  Stress:   . Feeling of Stress : Not on file  Social Connections:   . Frequency of Communication with Friends and Family: Not on file  . Frequency of Social Gatherings with Friends and Family: Not on file  . Attends Religious Services: Not on file  . Active Member of Clubs or Organizations: Not on file  . Attends Archivist Meetings: Not on file  . Marital Status: Not on file    Her Allergies Are:  Allergies  Allergen  Reactions  . Aspirin Nausea And Vomiting  . Penicillins Hives  :   Her Current Medications Are:  Outpatient Encounter Medications as of 11/27/2019  Medication Sig  . carvedilol (COREG) 12.5 MG tablet Take 2 tablets (25 mg total) by mouth 2 (two) times daily with a meal.  . isosorbide-hydrALAZINE (BIDIL) 20-37.5 MG tablet Take 2 tablets by mouth 3 (three) times daily.  Marland Kitchen spironolactone (ALDACTONE) 100 MG tablet Take 0.5 tablets (50 mg total) by mouth daily.  . Vitamin D, Ergocalciferol, (DRISDOL) 1.25 MG (50000 UT) CAPS capsule Take 1 capsule by mouth once a week.   No facility-administered encounter medications on file as of 11/27/2019.  :  Review of Systems:  Out of a complete 14 point review of systems, all are reviewed and negative with the exception of these symptoms as listed below: Review of Systems  Neurological:       Pt presented today due to having increase in BP concerns. They wanted to rule out if apnea was cause. Patient states intermittently she will snore. She states that she has never had a SS and wakes up feeling well rested.    Sitting and reading:0 Watching TV:1 Sitting inactive in a public place (ex. Theater or meeting): 0 As a passenger in a car for an hour without a break:0 Lying down to rest in the afternoon:2 Sitting and talking to someone:0 Sitting quietly after lunch (no alcohol):0 In a car, while stopped in traffic:0 Total:3     Objective:  Neurological Exam  Physical Exam Physical Examination:   Vitals:   11/27/19 0848  BP: (!) 180/105  Pulse: 96  Temp: (!) 97.2 F (36.2 C)    General Examination: The patient is a very pleasant 48 y.o. female in no acute distress. She appears well-developed and well-nourished and well groomed.   HEENT: Normocephalic, atraumatic, pupils are equal, round and reactive to light, extraocular tracking is good without limitation to gaze excursion or nystagmus noted. Hearing is grossly intact. Face is symmetric  with normal facial animation. Speech is clear with no dysarthria noted. There is no hypophonia. There is no lip, neck/head, jaw or voice tremor. Neck is supple with full range of passive and active motion. There are no carotid bruits on auscultation. Oropharynx exam reveals: mild mouth dryness, adequate dental hygiene and moderate airway crowding, due to Mallampati class III, redundant soft palate, tonsils in place which appear to be 1-2+ in size, not fully visualized and tip of uvula not fully visualized, wider tongue noted. Tongue protrudes centrally and palate elevates symmetrically. Neck circumference is 18-5/8 inches.  She does not have much in the way of overbite but has malalignment of her teeth.  Chest:  Clear to auscultation without wheezing, rhonchi or crackles noted.  Heart: S1+S2+0, regular and normal without murmurs, rubs or gallops noted.   Abdomen: Soft, non-tender and non-distended with normal bowel sounds appreciated on auscultation.  Extremities: There is trace pitting edema in the distal lower extremities bilaterally.   Skin: Warm and dry without trophic changes noted.   Musculoskeletal: exam reveals no obvious joint deformities, tenderness or joint swelling or erythema.   Neurologically:  Mental status: The patient is awake, alert and oriented in all 4 spheres. Her immediate and remote memory, attention, language skills and fund of knowledge are appropriate. There is no evidence of aphasia, agnosia, apraxia or anomia. Speech is clear with normal prosody and enunciation. Thought process is linear. Mood is normal and affect is normal.  Cranial nerves II - XII are as described above under HEENT exam.  Motor exam: Normal bulk, strength and tone is noted. There is no tremor, Romberg is negative. Fine motor skills and coordination: grossly intact.  Cerebellar testing: No dysmetria or intention tremor. There is no truncal or gait ataxia.  Sensory exam: intact to light touch in the  upper and lower extremities.  Gait, station and balance: She stands easily. No veering to one side is noted. No leaning to one side is noted. Posture is age-appropriate and stance is narrow based. Gait shows normal stride length and normal pace. No problems turning are noted. Tandem walk is unremarkable.                Assessment and Plan:   In summary, Jamie Brookesngela L Schwanz is a very pleasant 48 y.o.-year old female with an underlying medical history of hypertension, hyperlipidemia, vitamin D deficiency and morbid obesity with a BMI of over 45, whose history and physical exam are concerning for obstructive sleep apnea (OSA). I had a long chat with the patient about my findings and the diagnosis of OSA, its prognosis and treatment options. We talked about medical treatments, surgical interventions and non-pharmacological approaches. I explained in particular the risks and ramifications of untreated moderate to severe OSA, especially with respect to developing cardiovascular disease down the Road, including congestive heart failure, difficult to treat hypertension, cardiac arrhythmias, or stroke. Even type 2 diabetes has, in part, been linked to untreated OSA. Symptoms of untreated OSA include daytime sleepiness, memory problems, mood irritability and mood disorder such as depression and anxiety, lack of energy, as well as recurrent headaches, especially morning headaches. We talked about trying to maintain a healthy lifestyle in general, as well as the importance of weight control. We also talked about the importance of good sleep hygiene. I recommended the following at this time: sleep study.  I explained the sleep test procedure to the patient and also outlined possible surgical and non-surgical treatment options of OSA, including the use of a custom-made dental device (which would require a referral to a specialist dentist or oral surgeon), upper airway surgical options, such as traditional UPPP or a novel less  invasive surgical option in the form of Inspire hypoglossal nerve stimulation (which would involve a referral to an ENT surgeon). I also explained the CPAP treatment option to the patient, who indicated that she would be willing to try CPAP if the need arises. I explained the importance of being compliant with PAP treatment, not only for insurance purposes but primarily to improve Her symptoms, and for the patient's long term health benefit, including to reduce Her cardiovascular risks. I answered all her questions today and the patient  was in agreement. I plan to see her back after the sleep study is completed and encouraged her to call with any interim questions, concerns, problems or updates.   Thank you very much for allowing me to participate in the care of this nice patient. If I can be of any further assistance to you please do not hesitate to call me at [phone].  Sincerely,   Huston Foley, MD, PhD

## 2019-12-05 ENCOUNTER — Telehealth: Payer: Self-pay | Admitting: Neurology

## 2019-12-05 NOTE — Telephone Encounter (Signed)
Message given to Shirlean Mylar about pt needs to be call back about sleep consult.

## 2019-12-05 NOTE — Telephone Encounter (Signed)
Patient called back in regards to a missed call and VM received. Please follow up.

## 2019-12-05 NOTE — Telephone Encounter (Signed)
I called pt about receiving a vm for Jessica [name] in sleep consult. I stated message was sent to Jessica [name] about her return call to schedule sleep consult appt. Pt verbalized understanding.

## 2019-12-27 ENCOUNTER — Ambulatory Visit (INDEPENDENT_AMBULATORY_CARE_PROVIDER_SITE_OTHER): Payer: BC Managed Care – PPO | Admitting: Neurology

## 2019-12-27 ENCOUNTER — Other Ambulatory Visit: Payer: Self-pay

## 2019-12-27 DIAGNOSIS — R0683 Snoring: Secondary | ICD-10-CM

## 2019-12-27 DIAGNOSIS — G4720 Circadian rhythm sleep disorder, unspecified type: Secondary | ICD-10-CM

## 2019-12-27 DIAGNOSIS — R519 Headache, unspecified: Secondary | ICD-10-CM

## 2019-12-27 DIAGNOSIS — R030 Elevated blood-pressure reading, without diagnosis of hypertension: Secondary | ICD-10-CM

## 2019-12-27 DIAGNOSIS — Z9189 Other specified personal risk factors, not elsewhere classified: Secondary | ICD-10-CM

## 2019-12-27 DIAGNOSIS — G4733 Obstructive sleep apnea (adult) (pediatric): Secondary | ICD-10-CM | POA: Diagnosis not present

## 2019-12-27 DIAGNOSIS — R351 Nocturia: Secondary | ICD-10-CM

## 2020-01-03 NOTE — Procedures (Signed)
PATIENT'S NAME:  Jessica [name], Jessica [name] DOB:      1971/05/29      MR#:    [id number]     DATE OF RECORDING: 12/27/2019 REFERRING M.D.:  Truett Mainland, MD Study Performed:   Baseline Polysomnogram HISTORY:  49 year old woman with a history of hypertension, hyperlipidemia, vitamin D deficiency and morbid obesity with a BMI of over 45, who reports snoring and excessive daytime somnolence.  She has had difficult to control blood pressure. The patient endorsed the Epworth Sleepiness Scale at 3 points. The patient's weight 278 pounds with a height of 65 (inches), resulting in a BMI of 46.3 kg/m2. The patient's neck circumference measured 18.6 inches.  CURRENT MEDICATIONS: Coreg, Bidil, Aldactone, Drisdol   PROCEDURE:  This is a multichannel digital polysomnogram utilizing the Somnostar 11.2 system.  Electrodes and sensors were applied and monitored per AASM Specifications.   EEG, EOG, Chin and Limb EMG, were sampled at 200 Hz.  ECG, Snore and Nasal Pressure, Thermal Airflow, Respiratory Effort, CPAP Flow and Pressure, Oximetry was sampled at 50 Hz. Digital video and audio were recorded.      BASELINE STUDY  Lights Out was at 22:02 and Lights On at 04:51.  Total recording time (TRT) was 409.5 minutes, with a total sleep time (TST) of 317.5 minutes.   The patient's sleep latency was 38.5 minutes, which is delayed. REM latency was 117 minutes, which is high normal. The sleep efficiency was 77.5%.     SLEEP ARCHITECTURE: WASO (Wake after sleep onset) was 52 minutes with mild to moderate sleep fragmentation noted. There were 38 minutes in Stage N1, 238 minutes Stage N2, 17.5 minutes Stage N3 and 24 minutes in Stage REM.  The percentage of Stage N1 was 12.%, which is increased, Stage N2 was 75.%, which is increased, Stage N3 was 5.5%, which is reduced and Stage R (REM sleep) was 7.6%, which is reduced. The arousals were noted as: 33 were spontaneous, 3 were associated with PLMs, 8 were associated with respiratory  events.  RESPIRATORY ANALYSIS:  There were a total of 54 respiratory events:  8 obstructive apneas, 0 central apneas and 1 mixed apneas with a total of 9 apneas and an apnea index (AI) of 1.7 /hour. There were 45 hypopneas with a hypopnea index of 8.5 /hour. The patient also had 0 respiratory event related arousals (RERAs).      The total APNEA/HYPOPNEA INDEX (AHI) was 10.2/hour and the total RESPIRATORY DISTURBANCE INDEX was  10.2 /hour.  12 events occurred in REM sleep and 78 events in NREM. The REM AHI was  30 /hour, versus a non-REM AHI of 8.6. The patient spent 53.5 minutes of total sleep time in the supine position and 264 minutes in non-supine.. The supine AHI was 30.3 versus a non-supine AHI of 6.1.  OXYGEN SATURATION & C02:  The Wake baseline 02 saturation was 94%, with the lowest being 82%. Time spent below 89% saturation equaled 12 minutes.  PERIODIC LIMB MOVEMENTS: The patient had a total of 27 Periodic Limb Movements.  The Periodic Limb Movement (PLM) index was 5.1 and the PLM Arousal index was .6/hour.  Audio and video analysis did not show any abnormal or unusual movements, behaviors, phonations or vocalizations. The patient took no bathroom breaks. Mild to moderate snoring was noted. The EKG was in keeping with normal sinus rhythm (NSR).  Post-study, the patient indicated that sleep was worse than usual.   IMPRESSION:  1. Obstructive Sleep Apnea(OSA) 2. Dysfunctions associated with sleep stages  or arousal from sleep  RECOMMENDATIONS:  1. This study demonstrates overall mild obstructive sleep apnea, much more pronounced during supine and in REM sleep, with a total AHI of 10.2/hour, REM AHI of 30/hour, supine AHI of 30.3/hour, and O2 nadir of 82%. Given the patient's medical history and sleep related complaints, treatment with positive airway pressure is recommended; this can be achieved in the form of autoPAP. Alternatively, a full-night CPAP titration study would allow  optimization of therapy if needed. Other treatment options may include avoidance of supine sleep position along with weight loss, upper airway or jaw surgery in selected patients or the use of an oral appliance in certain patients. ENT evaluation and/or consultation with a maxillofacial surgeon or dentist may be feasible in some instances.    2. Please note that untreated obstructive sleep apnea may carry additional perioperative morbidity. Patients with significant obstructive sleep apnea should receive perioperative PAP therapy and the surgeons and particularly the anesthesiologist should be informed of the diagnosis and the severity of the sleep disordered breathing. 3. This study shows sleep fragmentation and abnormal sleep stage percentages; these are nonspecific findings and per se do not signify an intrinsic sleep disorder or a cause for the patient's sleep-related symptoms. Causes include (but are not limited to) the first night effect of the sleep study, circadian rhythm disturbances, medication effect or an underlying mood disorder or medical problem.  4. The patient should be cautioned not to drive, work at heights, or operate dangerous or heavy equipment when tired or sleepy. Review and reiteration of good sleep hygiene measures should be pursued with any patient. 5. The patient will be seen in follow-up by Dr. Rexene Alberts at [facility] for discussion of the test results and further management strategies. The referring provider will be notified of the test results.  I certify that I have reviewed the entire raw data recording prior to the issuance of this report in accordance with the Standards of Accreditation of the American Academy of Sleep Medicine (AASM)   Star Age, MD, PhD Diplomat, American Board of Neurology and Sleep Medicine (Neurology and Sleep Medicine)

## 2020-01-03 NOTE — Progress Notes (Signed)
Patient referred by Dr. Rosemary Holms, seen by me on 11/27/19, diagnostic PSG on 12/27/19.    Please call and notify the patient that the recent sleep study did confirm the diagnosis of obstructive sleep apnea. OSA is overall mild, but in the severe range during dream sleep and when she sleeps on her back. Therefore, it is worth treating to see if she feels better after treatment and if her BP numbers improve over time. To that end I recommend treatment for this in the form of autoPAP, which means, that we don't have to bring her back for a second sleep study with CPAP, but will let him try an autoPAP machine at home, through a DME company (of her choice, or as per insurance requirement). The DME representative will educate her on how to use the machine, how to put the mask on, etc. I have placed an order in the chart. Please send referral, talk to patient, send report to referring MD. We will need a FU in sleep clinic for 10 weeks post-PAP set up, please arrange that with me or one of our NPs. Thanks,   Huston Foley, MD, PhD [facility])

## 2020-01-03 NOTE — Addendum Note (Signed)
Addended by: Huston Foley on: 01/03/2020 06:30 PM   Modules accepted: Orders

## 2020-01-08 ENCOUNTER — Telehealth: Payer: Self-pay

## 2020-01-08 NOTE — Telephone Encounter (Signed)
-----   Message from Huston Foley, MD sent at 01/03/2020  6:30 PM EST ----- Patient referred by Dr. Rosemary Holms, seen by me on 11/27/19, diagnostic PSG on 12/27/19.    Please call and notify the patient that the recent sleep study did confirm the diagnosis of obstructive sleep apnea. OSA is overall mild, but in the severe range during dream sleep and when she sleeps on her back. Therefore, it is worth treating to see if she feels better after treatment and if her BP numbers improve over time. To that end I recommend treatment for this in the form of autoPAP, which means, that we don't have to bring her back for a second sleep study with CPAP, but will let him try an autoPAP machine at home, through a DME company (of her choice, or as per insurance requirement). The DME representative will educate her on how to use the machine, how to put the mask on, etc. I have placed an order in the chart. Please send referral, talk to patient, send report to referring MD. We will need a FU in sleep clinic for 10 weeks post-PAP set up, please arrange that with me or one of our NPs. Thanks,   Huston Foley, MD, PhD [facility])

## 2020-01-08 NOTE — Telephone Encounter (Signed)
I reached out to the pt and advised of results. Pt verbalized understanding and is agreeable to auto pap therapy.  Pt has been schedule for initial f/u for 04/21/2020 at 1130.   [employer] will be pt's DME.

## 2020-04-21 ENCOUNTER — Encounter: Payer: Self-pay | Admitting: Neurology

## 2020-04-21 ENCOUNTER — Ambulatory Visit: Payer: Self-pay | Admitting: Neurology

## 2020-04-23 ENCOUNTER — Telehealth: Payer: Self-pay

## 2020-04-23 NOTE — Telephone Encounter (Signed)
Patient no show for follow up  appt on 04/21/2020.

## 2021-04-23 ENCOUNTER — Encounter (HOSPITAL_BASED_OUTPATIENT_CLINIC_OR_DEPARTMENT_OTHER): Payer: Self-pay | Admitting: *Deleted

## 2021-04-23 ENCOUNTER — Emergency Department (HOSPITAL_BASED_OUTPATIENT_CLINIC_OR_DEPARTMENT_OTHER): Payer: BC Managed Care – PPO

## 2021-04-23 ENCOUNTER — Other Ambulatory Visit: Payer: Self-pay

## 2021-04-23 ENCOUNTER — Emergency Department (HOSPITAL_BASED_OUTPATIENT_CLINIC_OR_DEPARTMENT_OTHER)
Admission: EM | Admit: 2021-04-23 | Discharge: 2021-04-23 | Disposition: A | Discharge status: Home / Self Care | Payer: BC Managed Care – PPO | Attending: Emergency Medicine | Admitting: Emergency Medicine

## 2021-04-23 DIAGNOSIS — R202 Paresthesia of skin: Secondary | ICD-10-CM | POA: Insufficient documentation

## 2021-04-23 DIAGNOSIS — Z79899 Other long term (current) drug therapy: Secondary | ICD-10-CM | POA: Diagnosis not present

## 2021-04-23 DIAGNOSIS — I10 Essential (primary) hypertension: Secondary | ICD-10-CM | POA: Diagnosis not present

## 2021-04-23 DIAGNOSIS — R519 Headache, unspecified: Secondary | ICD-10-CM

## 2021-04-23 LAB — CBC WITH DIFFERENTIAL/PLATELET
Abs Immature Granulocytes: 0.02 10*3/uL (ref 0.00–0.07)
Basophils Absolute: 0.1 10*3/uL (ref 0.0–0.1)
Basophils Relative: 0 %
Eosinophils Absolute: 0.1 10*3/uL (ref 0.0–0.5)
Eosinophils Relative: 1 %
HCT: 39.7 % (ref 36.0–46.0)
Hemoglobin: 13.2 g/dL (ref 12.0–15.0)
Immature Granulocytes: 0 %
Lymphocytes Relative: 23 %
Lymphs Abs: 2.8 10*3/uL (ref 0.7–4.0)
MCH: 29.5 pg (ref 26.0–34.0)
MCHC: 33.2 g/dL (ref 30.0–36.0)
MCV: 88.8 fL (ref 80.0–100.0)
Monocytes Absolute: 0.7 10*3/uL (ref 0.1–1.0)
Monocytes Relative: 6 %
Neutro Abs: 8.3 10*3/uL — ABNORMAL HIGH (ref 1.7–7.7)
Neutrophils Relative %: 70 %
Platelets: 320 10*3/uL (ref 150–400)
RBC: 4.47 MIL/uL (ref 3.87–5.11)
RDW: 13.2 % (ref 11.5–15.5)
WBC: 12 10*3/uL — ABNORMAL HIGH (ref 4.0–10.5)
nRBC: 0 % (ref 0.0–0.2)

## 2021-04-23 LAB — COMPREHENSIVE METABOLIC PANEL
ALT: 23 U/L (ref 0–44)
AST: 24 U/L (ref 15–41)
Albumin: 4 g/dL (ref 3.5–5.0)
Alkaline Phosphatase: 72 U/L (ref 38–126)
Anion gap: 10 (ref 5–15)
BUN: 13 mg/dL (ref 6–20)
CO2: 26 mmol/L (ref 22–32)
Calcium: 9.4 mg/dL (ref 8.9–10.3)
Chloride: 97 mmol/L — ABNORMAL LOW (ref 98–111)
Creatinine, Ser: 0.85 mg/dL (ref 0.44–1.00)
GFR, Estimated: >60 mL/min (ref >60)
Glucose, Bld: 104 mg/dL — ABNORMAL HIGH (ref 70–99)
Potassium: 3.7 mmol/L (ref 3.5–5.1)
Sodium: 133 mmol/L — ABNORMAL LOW (ref 135–145)
Total Bilirubin: 1.5 mg/dL — ABNORMAL HIGH (ref 0.3–1.2)
Total Protein: 8.2 g/dL — ABNORMAL HIGH (ref 6.5–8.1)

## 2021-04-23 NOTE — ED Notes (Signed)
V/o from [place] ct head and labs

## 2021-04-23 NOTE — ED Triage Notes (Signed)
C/o right sided h/a and left facial numbness  at 12am lasting 30 mins . Normal this am

## 2021-04-23 NOTE — ED Provider Notes (Signed)
[facility] EMERGENCY DEPARTMENT Provider Note   CSN: [id number] Arrival date & time: 04/23/21  1657     History Chief Complaint  Patient presents with  . Numbness    Jessica [name] is a 50 y.o. female.  He has a history of hypertension but does not take her blood pressure medicine.  Recently tested negative for COVID and was put on a Z-Pak.  She said she woke up around midnight and noticed a right-sided headache and an area of numbness over her left cheek.  Not associate with any blurry vision double vision difficulty with her speech or numbness or weakness in 1 arm or leg.  She took an Excedrin and fell asleep.  When she woke up this morning all her symptoms had resolved.  The history is provided by the patient.  Headache Pain location:  Frontal Quality:  Dull Radiates to:  Does not radiate Severity currently:  0/10 Severity at highest:  3/10 Onset quality:  Gradual Duration:  1 hour Progression:  Resolved Chronicity:  New Relieved by:  Acetaminophen Worsened by:  Nothing Ineffective treatments:  None tried Associated symptoms: numbness   Associated symptoms: no abdominal pain, no blurred vision, no cough, no dizziness, no eye pain, no facial pain, no fever, no focal weakness, no neck pain, no sore throat, no syncope, no visual change and no vomiting        Past Medical History:  Diagnosis Date  . Hyperlipidemia   . Hypertension     Patient Active Problem List   Diagnosis Date Noted  . 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 Snoring 10/08/2019  . Essential hypertension 09/10/2019    Past Surgical History:  Procedure Laterality Date  . BUNIONECTOMY Right 2016  . HAND SURGERY Left 1993     OB History   No obstetric history on file.     Family History  Problem Relation Age of Onset  . Hypertension Mother   . Hypertension Father   . Heart disease Sister   . Colon cancer Brother     Social History   Tobacco Use  . Smoking status: Never  Smoker  . Smokeless tobacco: Never Used  Vaping Use  . Vaping Use: Never used  Substance Use Topics  . Alcohol use: Not Currently  . Drug use: Not Currently    Home Medications Prior to Admission medications   Medication Sig Start Date End Date Taking? Authorizing Provider  carvedilol (COREG) 12.5 MG tablet Take 2 tablets (25 mg total) by mouth 2 (two) times daily with a meal. 11/12/19   Patwardhan, Manish J, MD  isosorbide-hydrALAZINE (BIDIL) 20-37.5 MG tablet Take 2 tablets by mouth 3 (three) times daily. 11/12/19   Patwardhan, Anabel Bene, MD  spironolactone (ALDACTONE) 100 MG tablet Take 0.5 tablets (50 mg total) by mouth daily. 11/26/19   Patwardhan, Anabel Bene, MD  Vitamin D, Ergocalciferol, (DRISDOL) 1.25 MG (50000 UT) CAPS capsule Take 1 capsule by mouth once a week.    [provider]    Allergies    Aspirin and Penicillins  Review of Systems   Review of Systems  Constitutional: Negative for fever.  HENT: Negative for sore throat.   Eyes: Negative for blurred vision, pain and visual disturbance.  Respiratory: Negative for cough and shortness of breath.   Cardiovascular: Negative for chest pain and syncope.  Gastrointestinal: Negative for abdominal pain and vomiting.  Genitourinary: Negative for dysuria.  Musculoskeletal: Negative for neck pain.  Skin: Negative for rash.  Neurological: Positive  for numbness and headaches. Negative for dizziness and focal weakness.    Physical Exam Updated Vital Signs BP (!) 191/128 (BP Location: Left Arm)   Pulse (!) 108   Temp 98.7 F (37.1 C) (Oral)   Resp 20   Ht 5\' 6"  (1.676 m)   Wt 115.7 kg   LMP 04/06/2021   SpO2 92%   BMI 41.16 kg/m   Physical Exam Vitals and nursing note reviewed.  Constitutional:      General: She is not in acute distress.    Appearance: Normal appearance. She is well-developed. She is obese.  HENT:     Head: Normocephalic and atraumatic.  Eyes:     Extraocular Movements: Extraocular  movements intact.     Conjunctiva/sclera: Conjunctivae normal.     Pupils: Pupils are equal, round, and reactive to light.  Cardiovascular:     Rate and Rhythm: Normal rate and regular rhythm.     Heart sounds: No murmur heard.   Pulmonary:     Effort: Pulmonary effort is normal. No respiratory distress.     Breath sounds: Normal breath sounds.  Abdominal:     Palpations: Abdomen is soft.     Tenderness: There is no abdominal tenderness.  Musculoskeletal:        General: No deformity or signs of injury. Normal range of motion.     Cervical back: Neck supple.  Skin:    General: Skin is warm and dry.     Capillary Refill: Capillary refill takes less than 2 seconds.  Neurological:     General: No focal deficit present.     Mental Status: She is alert and oriented to person, place, and time.     Cranial Nerves: No cranial nerve deficit.     Sensory: No sensory deficit.     Motor: No weakness.     Gait: Gait normal.     ED Results / Procedures / Treatments   Labs (all labs ordered are listed, but only abnormal results are displayed) Labs Reviewed  CBC WITH DIFFERENTIAL/PLATELET - Abnormal; Notable for the following components:      Result Value   WBC 12.0 (*)    Neutro Abs 8.3 (*)    All other components within normal limits  COMPREHENSIVE METABOLIC PANEL - Abnormal; Notable for the following components:   Sodium 133 (*)    Chloride 97 (*)    Glucose, Bld 104 (*)    Total Protein 8.2 (*)    Total Bilirubin 1.5 (*)    All other components within normal limits    EKG EKG Interpretation  Date/Time:  Thursday Apr 23 2021 17:13:40 EDT Ventricular Rate:  106 PR Interval:  162 QRS Duration: 80 QT Interval:  344 QTC Calculation: 456 R Axis:   33 Text Interpretation: Sinus tachycardia Biatrial enlargement Nonspecific T wave abnormality Abnormal ECG No old tracing to compare Confirmed by 10-01-2002 [phone]) on 04/23/2021 5:18:01 PM   Radiology CT Head Wo  Contrast  Result Date: 04/23/2021 CLINICAL DATA:  Right-sided headache and left-sided facial numbness. EXAM: CT HEAD WITHOUT CONTRAST TECHNIQUE: Contiguous axial images were obtained from the base of the skull through the vertex without intravenous contrast. COMPARISON:  MR head dated January 04, 2014 FINDINGS: Brain: No evidence of acute infarction, hemorrhage, hydrocephalus, extra-axial collection or mass lesion/mass effect. There are areas of decreased attenuation within the white matter tracts of the supratentorial brain, consistent with microvascular disease changes. Small, chronic bilateral para thalamic lacunar infarcts are noted. Vascular:  No hyperdense vessel or unexpected calcification. Skull: Normal. Negative for fracture or focal lesion. Sinuses/Orbits: No acute finding. Other: None. IMPRESSION: 1. 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 Electronically Signed   By: Aram Candela M.D.   On: 04/23/2021 17:57    Procedures Procedures   Medications Ordered in ED Medications - No data to display  ED Course  I have reviewed the triage vital signs and the nursing notes.  Pertinent labs & imaging results that were available during my care of the patient were reviewed by me and considered in my medical decision making (see chart for details).    MDM Rules/Calculators/A&P                         This patient complains of right frontal headache and left cheek numbness that occurred yesterday and has since resolved; this involves an extensive number of treatment Options and is a complaint that carries with it a high risk of complications and Morbidity. The differential includes complex migraine, stroke, TIA, hypertensive urgency, metabolic derangement  I ordered, reviewed and interpreted labs, which included CBC with mildly elevated white count nonspecific, normal hemoglobin, chemistries fairly normal other than isolated LFT  abnormality of a T bili of 1.5 I ordered imaging studies which included head CT and I independently    visualized and interpreted imaging which showed microvascular changes no acute Previous records obtained and reviewed in epic, blood pressure was seen to be chronically elevated  After the interventions stated above, I reevaluated the patient and found patient currently be asymptomatic.  I recommended patient to contact PCP and restart her blood pressure medications.  Return instructions discussed   Final Clinical Impression(s) / ED Diagnoses Final diagnoses:  Paresthesia  Frontal headache  Primary hypertension    Rx / DC Orders ED Discharge Orders    None       Terrilee Files, MD 04/24/21 [phone]

## 2021-04-23 NOTE — Discharge Instructions (Addendum)
You were seen in the emergency department for evaluation of a frontal headache and some numbness on the left cheek.  You had blood work and a CAT scan of your head that did not show an obvious explanation of your symptoms.  Your blood pressure was very elevated here and you will need follow-up with your primary care doctor for further evaluation of this.  Please return to the emergency department for any worsening or concerning symptoms.

## 2021-04-27 ENCOUNTER — Ambulatory Visit: Payer: BC Managed Care – PPO | Admitting: Podiatry

## 2021-05-06 ENCOUNTER — Other Ambulatory Visit: Payer: Self-pay | Admitting: Podiatry

## 2021-05-06 ENCOUNTER — Other Ambulatory Visit: Payer: Self-pay

## 2021-05-06 ENCOUNTER — Ambulatory Visit (INDEPENDENT_AMBULATORY_CARE_PROVIDER_SITE_OTHER): Payer: BC Managed Care – PPO

## 2021-05-06 ENCOUNTER — Ambulatory Visit: Payer: BC Managed Care – PPO | Admitting: Podiatry

## 2021-05-06 DIAGNOSIS — M21619 Bunion of unspecified foot: Secondary | ICD-10-CM | POA: Diagnosis not present

## 2021-05-06 DIAGNOSIS — M79672 Pain in left foot: Secondary | ICD-10-CM | POA: Diagnosis not present

## 2021-05-06 DIAGNOSIS — M21612 Bunion of left foot: Secondary | ICD-10-CM

## 2021-05-06 DIAGNOSIS — M2042 Other hammer toe(s) (acquired), left foot: Secondary | ICD-10-CM

## 2021-05-06 HISTORY — PX: LEG SURGERY: SHX1003

## 2021-05-07 ENCOUNTER — Encounter: Payer: Self-pay | Admitting: Podiatry

## 2021-05-07 NOTE — Progress Notes (Signed)
Subjective:   Patient ID: Jessica [name], female   DOB: 50 y.o.   MRN: [id number]   HPI Patient presents stating this bunion on my left is really starting to bother me and it the little toe also.  States she is tried wider shoes she is tried trimming and other modalities and knows she is needed surgery but has not been available in the past the right foot has done well with previous surgery done about 6 years ago and patient does not smoke likes to be active   Review of Systems  All other systems reviewed and are negative.       Objective:  Physical Exam Vitals and nursing note reviewed.  Constitutional:      Appearance: She is well-developed.  Pulmonary:     Effort: Pulmonary effort is normal.  Musculoskeletal:        General: Normal range of motion.  Skin:    General: Skin is warm.  Neurological:     Mental Status: She is alert.     Neurovascular status intact muscle strength adequate range of motion adequate with patient found to have hyperostosis medial aspect first metatarsal head left with redness around the joint painful when pressed with keratotic lesion digit 5 left painful when pressed.  Patient has good digital perfusion well oriented x3 with excellent incision sites correction of the right first metatarsal right fifth toe     Assessment:  Significant structural bunion deformity left with pain hammertoe deformity fifth right foot pain well corrected right foot     Plan:  H&P x-rays reviewed conditions discussed reviewed conservative surgical treatments and due to longstanding nature and how well she did on the right foot she wants surgery.  At this point I allowed her to read consent form going over a distal osteotomy left along with arthroplasty and explained procedures risk to patient and patient is willing to accept risk and understands all complications as outlined and is scheduled for outpatient surgery and is encouraged to call with questions concerns prior to  procedure.  Patient had air fracture walker dispensed that I wanted to get used to prior to surgery and bring with her to surgery and patient does understand that total recovery from a procedure such as this can take 6 months to 1 year  X-rays indicate that there is elevation of the intermetatarsal angle left of approximate 15 degrees and rotated fifth digit left foot

## 2021-05-11 ENCOUNTER — Telehealth: Payer: Self-pay | Admitting: Urology

## 2021-05-11 NOTE — Telephone Encounter (Signed)
DOS - 05/26/21  [place] BUNIONECTOMY LEFT --- [id number] [facility] REPAIR 5TH LEFT --- [id number]   [employer] EFFECTIVE DATE -  12/06/20  PLAN DEDUCTIBLE - $1,500.00 W/ $0.00 REMAINING OUT OF POCKET - $5,900.00 W/ $3,546.56  REMAINING COINSURANCE - 30% COPAY - $0.00   NO PRIOR AUTH REQUIRED

## 2021-05-25 MED ORDER — ONDANSETRON HCL 4 MG PO TABS: 4.0000 mg | ORAL_TABLET | Freq: Three times a day (TID) | ORAL | 0 refills | Status: DC | PRN

## 2021-05-25 MED ORDER — OXYCODONE-ACETAMINOPHEN 10-325 MG PO TABS: 1.0000 | ORAL_TABLET | ORAL | 0 refills | Status: DC | PRN

## 2021-05-25 NOTE — Addendum Note (Signed)
Addended by: Lenn Sink on: 05/25/2021 02:31 PM   Modules accepted: Orders

## 2021-05-26 DIAGNOSIS — M2042 Other hammer toe(s) (acquired), left foot: Secondary | ICD-10-CM

## 2021-05-26 DIAGNOSIS — M2012 Hallux valgus (acquired), left foot: Secondary | ICD-10-CM

## 2021-06-01 ENCOUNTER — Ambulatory Visit (INDEPENDENT_AMBULATORY_CARE_PROVIDER_SITE_OTHER): Payer: BC Managed Care – PPO | Admitting: Podiatry

## 2021-06-01 ENCOUNTER — Ambulatory Visit (INDEPENDENT_AMBULATORY_CARE_PROVIDER_SITE_OTHER): Payer: BC Managed Care – PPO

## 2021-06-01 ENCOUNTER — Other Ambulatory Visit: Payer: Self-pay

## 2021-06-01 ENCOUNTER — Encounter: Payer: Self-pay | Admitting: Podiatry

## 2021-06-01 DIAGNOSIS — M21619 Bunion of unspecified foot: Secondary | ICD-10-CM

## 2021-06-01 DIAGNOSIS — M2042 Other hammer toe(s) (acquired), left foot: Secondary | ICD-10-CM | POA: Diagnosis not present

## 2021-06-03 NOTE — Progress Notes (Signed)
Subjective:   Patient ID: Jessica [name], female   DOB: 50 y.o.   MRN: [id number]   HPI Patient states she is improved with her left foot very pleased so far able to bear weight on it with minimal discomfort neuro   ROS      Objective:  Physical Exam  Vascular status intact with patient showing excellent healing first MPJ left fifth digit good alignment noted pathology with no drainage no indications of a good structural alignment     Assessment:  Doing well post forefoot reconstruction left     Plan:  H&P reviewed condition recommended the continuation of compression elevation immobilization reapplied sterile dressing instructed on continuing to be careful with this and will be seen back 2 weeks suture removal earlier if needed  X-rays indicate osteotomy is healing well good alignment noted fixation in place

## 2021-06-15 ENCOUNTER — Other Ambulatory Visit: Payer: Self-pay

## 2021-06-15 ENCOUNTER — Ambulatory Visit (INDEPENDENT_AMBULATORY_CARE_PROVIDER_SITE_OTHER): Payer: BC Managed Care – PPO

## 2021-06-15 DIAGNOSIS — M2042 Other hammer toe(s) (acquired), left foot: Secondary | ICD-10-CM | POA: Diagnosis not present

## 2021-06-15 NOTE — Progress Notes (Signed)
Patient seen in office today for suture removal and x-ray. Sutures removed from lateral aspect of 5th digit without complications. Patient denies nausea, vomiting, fever, chills or pain. Patient advise that she can transition slowly from the surgical shoe into a supportive regular shoe. Patient also advised she can get the area wet and it is okay to shower. Patient verbalized understanding. Advised patient to call the office with any questions, comments, or concerns.

## 2021-06-17 ENCOUNTER — Telehealth: Payer: Self-pay | Admitting: Podiatry

## 2021-06-17 NOTE — Telephone Encounter (Signed)
Patient wanted to know if she could come in and exchange her sx shoe. She stated once her stiches came out the shoe became too loose. She is requesting a smaller size.  Please Advise

## 2021-06-17 NOTE — Telephone Encounter (Signed)
Probably doesn't need a surgical shoe anymore. Should be able to start sandal or tennis shoe in next few days

## 2021-07-01 ENCOUNTER — Telehealth: Payer: Self-pay | Admitting: Podiatry

## 2021-07-01 NOTE — Telephone Encounter (Signed)
Patient would like to extend return to work until 07/21/2021, due to swelling. She also still has a stitch left in her foot that still has not dissolved.

## 2021-07-02 ENCOUNTER — Encounter: Payer: Self-pay | Admitting: Podiatry

## 2021-07-02 NOTE — Telephone Encounter (Signed)
That is fine. It should fall off

## 2021-07-07 ENCOUNTER — Telehealth: Payer: Self-pay | Admitting: *Deleted

## 2021-07-07 NOTE — Telephone Encounter (Signed)
Monica with [employer] [phone]), is wanting a call back concerning patient's reason for being out of work from Aug. 1st-Aug. 15 th. Has tried several attempts to reach but no call back.Please contact.

## 2021-07-12 ENCOUNTER — Other Ambulatory Visit: Payer: Self-pay

## 2021-07-12 ENCOUNTER — Emergency Department (HOSPITAL_BASED_OUTPATIENT_CLINIC_OR_DEPARTMENT_OTHER): Payer: BC Managed Care – PPO

## 2021-07-12 ENCOUNTER — Encounter (HOSPITAL_BASED_OUTPATIENT_CLINIC_OR_DEPARTMENT_OTHER): Payer: Self-pay

## 2021-07-12 ENCOUNTER — Emergency Department (HOSPITAL_BASED_OUTPATIENT_CLINIC_OR_DEPARTMENT_OTHER)
Admission: EM | Admit: 2021-07-12 | Discharge: 2021-07-12 | Disposition: A | Discharge status: Home / Self Care | Payer: BC Managed Care – PPO | Attending: Emergency Medicine | Admitting: Emergency Medicine

## 2021-07-12 DIAGNOSIS — Z79899 Other long term (current) drug therapy: Secondary | ICD-10-CM | POA: Insufficient documentation

## 2021-07-12 DIAGNOSIS — R350 Frequency of micturition: Secondary | ICD-10-CM | POA: Diagnosis not present

## 2021-07-12 DIAGNOSIS — I1A0 Resistant hypertension: Secondary | ICD-10-CM

## 2021-07-12 DIAGNOSIS — R0602 Shortness of breath: Secondary | ICD-10-CM

## 2021-07-12 DIAGNOSIS — I10 Essential (primary) hypertension: Secondary | ICD-10-CM | POA: Diagnosis not present

## 2021-07-12 LAB — CBC WITH DIFFERENTIAL/PLATELET
Abs Immature Granulocytes: 0.04 10*3/uL (ref 0.00–0.07)
Basophils Absolute: 0.1 10*3/uL (ref 0.0–0.1)
Basophils Relative: 1 %
Eosinophils Absolute: 0.1 10*3/uL (ref 0.0–0.5)
Eosinophils Relative: 1 %
HCT: 40.1 % (ref 36.0–46.0)
Hemoglobin: 13.6 g/dL (ref 12.0–15.0)
Immature Granulocytes: 0 %
Lymphocytes Relative: 17 %
Lymphs Abs: 1.8 10*3/uL (ref 0.7–4.0)
MCH: 29.6 pg (ref 26.0–34.0)
MCHC: 33.9 g/dL (ref 30.0–36.0)
MCV: 87.4 fL (ref 80.0–100.0)
Monocytes Absolute: 0.5 10*3/uL (ref 0.1–1.0)
Monocytes Relative: 5 %
Neutro Abs: 8 10*3/uL — ABNORMAL HIGH (ref 1.7–7.7)
Neutrophils Relative %: 76 %
Platelets: 312 10*3/uL (ref 150–400)
RBC: 4.59 MIL/uL (ref 3.87–5.11)
RDW: 13.1 % (ref 11.5–15.5)
WBC: 10.5 10*3/uL (ref 4.0–10.5)
nRBC: 0 % (ref 0.0–0.2)

## 2021-07-12 LAB — URINALYSIS, ROUTINE W REFLEX MICROSCOPIC
Bilirubin Urine: NEGATIVE
Glucose, UA: NEGATIVE mg/dL
Hgb urine dipstick: NEGATIVE
Ketones, ur: NEGATIVE mg/dL
Leukocytes,Ua: NEGATIVE
Nitrite: NEGATIVE
Protein, ur: NEGATIVE mg/dL
Specific Gravity, Urine: <1.005 — ABNORMAL LOW (ref 1.005–1.030)
pH: 6.5 (ref 5.0–8.0)

## 2021-07-12 LAB — BASIC METABOLIC PANEL
Anion gap: 10 (ref 5–15)
BUN: 8 mg/dL (ref 6–20)
CO2: 25 mmol/L (ref 22–32)
Calcium: 9.2 mg/dL (ref 8.9–10.3)
Chloride: 100 mmol/L (ref 98–111)
Creatinine, Ser: 0.8 mg/dL (ref 0.44–1.00)
GFR, Estimated: >60 mL/min (ref >60)
Glucose, Bld: 108 mg/dL — ABNORMAL HIGH (ref 70–99)
Potassium: 3.7 mmol/L (ref 3.5–5.1)
Sodium: 135 mmol/L (ref 135–145)

## 2021-07-12 LAB — BRAIN NATRIURETIC PEPTIDE: B Natriuretic Peptide: 161.9 pg/mL — ABNORMAL HIGH (ref 0.0–100.0)

## 2021-07-12 LAB — TROPONIN I (HIGH SENSITIVITY)
Troponin I (High Sensitivity): 20 ng/L — ABNORMAL HIGH (ref <18)
Troponin I (High Sensitivity): 20 ng/L — ABNORMAL HIGH (ref <18)

## 2021-07-12 MED ORDER — HYDRALAZINE HCL 25 MG PO TABS
50.0000 mg | ORAL_TABLET | Freq: Once | ORAL | Status: AC
  Administered 2021-07-12: 50 mg via ORAL
  Filled 2021-07-12: qty 2

## 2021-07-12 MED ORDER — ISOSORB DINITRATE-HYDRALAZINE 20-37.5 MG PO TABS: 2.0000 | ORAL_TABLET | Freq: Once | ORAL | Status: DC

## 2021-07-12 MED ORDER — IOHEXOL 350 MG/ML SOLN
100.0000 mL | Freq: Once | INTRAVENOUS | Status: AC | PRN
  Administered 2021-07-12: 100 mL via INTRAVENOUS

## 2021-07-12 MED ORDER — CARVEDILOL 25 MG PO TABS: 25.0000 mg | ORAL_TABLET | Freq: Two times a day (BID) | ORAL | 0 refills | Status: DC

## 2021-07-12 MED ORDER — CARVEDILOL 12.5 MG PO TABS
12.5000 mg | ORAL_TABLET | Freq: Two times a day (BID) | ORAL | Status: DC
  Administered 2021-07-12: 12.5 mg via ORAL
  Filled 2021-07-12: qty 1

## 2021-07-12 NOTE — ED Triage Notes (Signed)
Pt arrives ambulatory to ED with c/o shortness of breath for the last "2-3 nights" pt denies any other symptoms pt states she has sleep apnea, pt does use CPAP, states that she has not been using it over the last few nights.

## 2021-07-12 NOTE — Discharge Instructions (Addendum)
Take your carvedilol as prescribed.  Follow-up with your cardiologist as planned.  Return to the emergency room for new or worsening symptoms.

## 2021-07-12 NOTE — ED Notes (Addendum)
Pt SpO2 while ambulated was 96-100, Heart rate while ambulated was 96-99.

## 2021-07-12 NOTE — ED Provider Notes (Signed)
[facility] EMERGENCY DEPARTMENT Provider Note   CSN: [id number] Arrival date & time: 07/12/21  1320     History Chief Complaint  Patient presents with   Shortness of Breath    Jessica [name] is a 50 y.o. female.  50 year old female presents with complaint of shortness of breath.  Patient states that she noticed feeling short of breath when she went to bed Thursday night, had symptoms again Friday night and Saturday night (today is Sunday).  States that she is short of breath periodically throughout the day however not necessarily with exertion, not currently short of breath.  Denies ever experiencing chest pain with this.  States that she does have swelling of her left foot due to bunion surgery 1 month ago.  Denies calf swelling or pain.  Denies cough, fever, chills, congestion or other URI symptoms.  Patient does not take her blood pressure medication as it makes her feel dizzy.  Also complains of urinary frequency without dysuria, has been drinking more water to try and flush this out of her system.  No other complaints or concerns today. History of hypertension, denies history of hyperlipidemia, diabetes or heart history.  Patient is a non-smoker.      Past Medical History:  Diagnosis Date   Hyperlipidemia    Hypertension     Patient Active Problem List   Diagnosis Date Noted   Resistant hypertension 11/12/2019   Snoring 10/08/2019   Essential hypertension 09/10/2019   Morbid obesity (HCC) 08/20/2013    Past Surgical History:  Procedure Laterality Date   BUNIONECTOMY Right 2016   FOOT SURGERY     HAND SURGERY Left 1993     OB History   No obstetric history on file.     Family History  Problem Relation Age of Onset   Hypertension Mother    Hypertension Father    Heart disease Sister    Colon cancer Brother     Social History   Tobacco Use   Smoking status: Never   Smokeless tobacco: Never  Vaping Use   Vaping Use: Never used  Substance Use  Topics   Alcohol use: Not Currently   Drug use: Not Currently    Home Medications Prior to Admission medications   Medication Sig Start Date End Date Taking? Authorizing Provider  carvedilol (COREG) 25 MG tablet Take 1 tablet (25 mg total) by mouth 2 (two) times daily with a meal. 07/12/21 08/11/21  Jeannie Fend, PA-C  isosorbide-hydrALAZINE (BIDIL) 20-37.5 MG tablet Take 2 tablets by mouth 3 (three) times daily. 11/12/19   Patwardhan, Anabel Bene, MD  spironolactone (ALDACTONE) 100 MG tablet Take 0.5 tablets (50 mg total) by mouth daily. 11/26/19   Patwardhan, Anabel Bene, MD  Vitamin D, Ergocalciferol, (DRISDOL) 1.25 MG (50000 UT) CAPS capsule Take 1 capsule by mouth once a week.    [provider]    Allergies    Aspirin and Penicillins  Review of Systems   Review of Systems  Constitutional:  Negative for chills, diaphoresis, fatigue and fever.  HENT:  Negative for congestion.   Respiratory:  Positive for shortness of breath. Negative for cough.   Cardiovascular:  Negative for chest pain, palpitations and leg swelling.  Gastrointestinal:  Negative for abdominal pain, constipation, diarrhea, nausea and vomiting.  Genitourinary:  Positive for frequency. Negative for dysuria.  Musculoskeletal:  Negative for arthralgias and myalgias.  Skin:  Negative for wound.  Allergic/Immunologic: Negative for immunocompromised state.  Neurological:  Negative for weakness.  Psychiatric/Behavioral:  Negative for confusion.   All other systems reviewed and are negative.  Physical Exam Updated Vital Signs BP (!) 169/97   Pulse 83   Temp 99 F (37.2 C)   Resp 17   Ht 5\' 6"  (1.676 m)   Wt 126.6 kg   LMP 07/04/2021   SpO2 100%   BMI 45.03 kg/m   Physical Exam Vitals and nursing note reviewed.  Constitutional:      General: She is not in acute distress.    Appearance: She is well-developed. She is obese. She is not diaphoretic.  HENT:     Head: Normocephalic and atraumatic.   Cardiovascular:     Rate and Rhythm: Normal rate and regular rhythm.  Pulmonary:     Effort: Pulmonary effort is normal.     Breath sounds: Normal breath sounds. No decreased breath sounds.  Chest:     Chest wall: No tenderness.  Abdominal:     Palpations: Abdomen is soft.     Tenderness: There is no abdominal tenderness.  Musculoskeletal:     Right lower leg: No tenderness. No edema.     Left lower leg: No tenderness. No edema.  Skin:    General: Skin is warm and dry.     Findings: No erythema or rash.  Neurological:     Mental Status: She is alert and oriented to person, place, and time.  Psychiatric:        Behavior: Behavior normal.    ED Results / Procedures / Treatments   Labs (all labs ordered are listed, but only abnormal results are displayed) Labs Reviewed  BASIC METABOLIC PANEL - Abnormal; Notable for the following components:      Result Value   Glucose, Bld 108 (*)    All other components within normal limits  CBC WITH DIFFERENTIAL/PLATELET - Abnormal; Notable for the following components:   Neutro Abs 8.0 (*)    All other components within normal limits  BRAIN NATRIURETIC PEPTIDE - Abnormal; Notable for the following components:   B Natriuretic Peptide 161.9 (*)    All other components within normal limits  URINALYSIS, ROUTINE W REFLEX MICROSCOPIC - Abnormal; Notable for the following components:   Color, Urine STRAW (*)    Specific Gravity, Urine <1.005 (*)    All other components within normal limits  TROPONIN I (HIGH SENSITIVITY) - Abnormal; Notable for the following components:   Troponin I (High Sensitivity) 20 (*)    All other components within normal limits  TROPONIN I (HIGH SENSITIVITY) - Abnormal; Notable for the following components:   Troponin I (High Sensitivity) 20 (*)    All other components within normal limits    EKG EKG Interpretation  Date/Time:  Sunday July 12 2021 13:36:05 EDT Ventricular Rate:  106 PR Interval:  162 QRS  Duration: 92 QT Interval:  343 QTC Calculation: 456 R Axis:   46 Text Interpretation: Sinus tachycardia Left atrial enlargement Abnormal T, consider ischemia, lateral leads since last tracing no significant change Confirmed by [id number]) on 07/12/2021 2:33:55 PM  Radiology DG Chest 2 View  Result Date: 07/12/2021 CLINICAL DATA:  Acute shortness of breath. EXAM: CHEST - 2 VIEW COMPARISON:  None. FINDINGS: UPPER limits normal heart size with pulmonary vascular congestion noted. There is no evidence of focal airspace disease, pulmonary edema, suspicious pulmonary nodule/mass, pleural effusion, or pneumothorax. No acute bony abnormalities are identified. IMPRESSION: UPPER limits normal heart size with pulmonary vascular congestion. Electronically Signed   By: 09/11/2021  Hu M.D.   On: 07/12/2021 14:56   CT Angio Chest PE W/Cm &/Or Wo Cm  Result Date: 07/12/2021 CLINICAL DATA:  Suspected pulmonary embolus.  Shortness of breath. EXAM: CT ANGIOGRAPHY CHEST WITH CONTRAST TECHNIQUE: Multidetector CT imaging of the chest was performed using the standard protocol during bolus administration of intravenous contrast. Multiplanar CT image reconstructions and MIPs were obtained to evaluate the vascular anatomy. CONTRAST:  OMNIPAQUE IOHEXOL 350 MG/ML SOLN COMPARISON:  Chest x-ray on 07/12/2021 FINDINGS: Cardiovascular: Heart is mildly enla2rged. Trace pericardial effusion. No significant coronary artery calcifications. The thoracic aorta is not aneurysmal. Probable bovine arch anatomy. Significant artifact in the UPPER chest. Pulmonary arteries are well opacified by contrast bolus. There is no acute pulmonary embolus. Mediastinum/Nodes: The visualized portion of the thyroid gland has a normal appearance. No significant mediastinal, hilar, or axillary adenopathy. Esophagus is unremarkable. Lungs/Pleura: No pleural effusions or consolidations. No pulmonary edema. Airways are patent. Upper Abdomen: No acute  abnormality. Musculoskeletal: Moderate degenerative changes in the thoracic spine. No acute osseous abnormality. Review of the MIP images confirms the above findings. IMPRESSION: 1. Technically adequate exam showing no acute pulmonary embolus. 2. Mild cardiomegaly.  No edema or acute pulmonary abnormality. Electronically Signed   By: Norva Pavlov M.D.   On: 07/12/2021 18:57    Procedures Procedures   Medications Ordered in ED Medications  carvedilol (COREG) tablet 12.5 mg (12.5 mg Oral Given 07/12/21 1739)  hydrALAZINE (APRESOLINE) tablet 50 mg (50 mg Oral Given 07/12/21 1738)  iohexol (OMNIPAQUE) 350 MG/ML injection 100 mL (100 mLs Intravenous Contrast Given 07/12/21 1817)    ED Course  I have reviewed the triage vital signs and the nursing notes.  Pertinent labs & imaging results that were available during my care of the patient were reviewed by me and considered in my medical decision making (see chart for details).  Clinical Course as of 07/12/21 2245  Wynelle Link Jul 12, 2021  524 50 year old female complaint of shortness of breath primarily when she is trying to sleep for the past few nights, occasionally throughout the day.  States that she is typically a side sleeper, does not usually use her CPAP but has been using it for the past few nights due to this complaint.  Denies lower extremity edema beyond a little bit of swelling in her left foot from recent bunionectomy. Lungs are clear to auscultation, abdomen soft and nontender. Patient is hypertensive, has not been taking her blood pressure medications because they make her feel dizzy.  Room air O2 sat 95%.  She is tachycardic on arrival in the department, this has improved with time as well as medication given.  Her blood pressure has also improved with carvedilol and hydralazine. Patient CBC is without significant findings, her BMP is unremarkable.  Urinalysis does not show protein.  BNP is mildly elevated at 161.9.  Her initial troponin is  20, her troponin is repeated and is flat at 20.  EKG reviewed with Dr. Fredderick Phenix, no significant change from her priors. Chest x-ray shows upper limits normal heart size with pulmonary vascular congestion. Case discussed with Dr. Clarice Pole, ER attending. Due to complaint of shortness of breath with her hypertension and recent surgery, CT is obtained to evaluate for PE and is negative. Blood pressures compared to prior on file, appears consistent with priors. Patient has not followed up with her cardiologist in the past year or 2.  Discussed her work-up today to evaluate for hypertensive emergency.  There is no evidence of  endorgan damage at this time, she is not short of breath with lying supine in the room nor with ambulatory pulse ox.  Patient states that overall she is feeling better after her work-up today and would like to be discharged home.  She understands importance of follow-up with her cardiologist as well as the importance of compliance with blood pressure medication. [LM]    Clinical Course User Index [LM] Alden HippMurphy, Adalae Baysinger A, PA-C   MDM Rules/Calculators/A&P                           Final Clinical Impression(s) / ED Diagnoses Final diagnoses:  SOB (shortness of breath)  Hypertension, unspecified type  Resistant hypertension    Rx / DC Orders ED Discharge Orders          Ordered    carvedilol (COREG) 25 MG tablet  2 times daily with meals        07/12/21 2008             Alden HippMurphy, Klani Caridi A, PA-C 07/12/21 2245    Arby BarrettePfeiffer, Marcy, MD 08/09/21 1453

## 2021-07-14 ENCOUNTER — Telehealth: Payer: Self-pay | Admitting: Podiatry

## 2021-07-14 NOTE — Telephone Encounter (Signed)
Patient called and said that she still has a stitch in her foot and shes scheduled to return to work 07/21/2021. Please advise?

## 2021-07-15 DIAGNOSIS — R0609 Other forms of dyspnea: Secondary | ICD-10-CM | POA: Insufficient documentation

## 2021-07-15 DIAGNOSIS — R0600 Dyspnea, unspecified: Secondary | ICD-10-CM | POA: Insufficient documentation

## 2021-07-15 NOTE — Telephone Encounter (Signed)
Jessica [name], you could see her to take out stitch that may be left

## 2021-07-15 NOTE — Progress Notes (Signed)
Patient referred by Jessica Commons, FNP for hypertension  Subjective:   Jessica [name], female    DOB: 02-21-1971, 50 y.o.   MRN: [id number]  Chief Complaint  Patient presents with   Shortness of Breath   Hypertension     HPI  51 y.o. African-American female with hypertension. Patient was recently seen at [facility] emergency department on 07/12/2021 with complaints of shortness of breath.  She was found to be hypertensive at 170/70 mmHg.  BNP was mildly elevated at161.  High-sensitivity troponin was mildly elevated and flat at 20.  CTA showed no PE, but showed mild cardiomegaly.  Given no delta in her troponins, she was discharged from ED and recommended follow-up with me.  I last saw the patient in 2020. She has not been compliant with her antihypertensive therapy. Recently, she has experienced worsening exertional dyspnea and leg edema. Blood pressure remains elevated. She denies chest pain.    Current Outpatient Medications on File Prior to Visit  Medication Sig Dispense Refill   carvedilol (COREG) 25 MG tablet Take 1 tablet (25 mg total) by mouth 2 (two) times daily with a meal. 60 tablet 0   Vitamin D, Ergocalciferol, (DRISDOL) 1.25 MG (50000 UT) CAPS capsule Take 1 capsule by mouth once a week.     No current facility-administered medications on file prior to visit.    Cardiovascular studies:  EKG 07/16/2021: Sinus rhythm 75 bpm Left atrial enlargement Left ventricular hypertrophy Lead loss V5 Lateral T wave inversion, consider ischemia    CTA chest 07/12/2021: 1. Technically adequate exam showing no acute pulmonary embolus. 2. Mild cardiomegaly.  No edema or acute pulmonary abnormality.  Renal artery duplex  09/04/2019: No evidence of renal artery occlusive disease in either renal artery. Normal velocity in bilateral renal arteries.  Normal intrarenal vascular perfusion is noted in both kidneys. Normal kidney size bilaterally.  Normal study.    Echocardiogram 09/04/2019:  Normal LV systolic function with EF 55%. Left ventricle cavity is normal in size. Moderate concentric hypertrophy of the left ventricle. Normal global wall motion. Doppler evidence of grade II (pseudonormal) diastolic dysfunction. Diastolic dysfunction do not suggest elevated LA/LV endiastolic pressure. Left atrial cavity is mildly dilated at 4.1 cm. IVC is dilated with a respiratory response of <50%. This may suggest elevated right heart pressure   EKG 07/17/2019:  Sinus rhythm 84 bpm.  Inferolateral T wave inversions, consider ischemia.  Recent labs: 07/12/2021: Glucose 108, BUN/Cr 8/0.80. EGFR >60. Na/K 135/3.7.  H/H 13/40. MCV 87. Platelets 312  Results for Jessica [name], Jessica [name] (MRN [id number]) as of 07/15/2021 16:43  Ref. Range 07/12/2021 15:08 07/12/2021 15:45 07/12/2021 16:59  B Natriuretic Peptide Latest Ref Range: 0.0 - 100.0 pg/mL 161.9 (H)    Troponin I (High Sensitivity) Latest Ref Range: <18 ng/L 20 (H)  20 (H)    06/28/2019: Glucose 90. BUN/Cr 20/0.8., eGFR 74. Na/K 141/4.4 H/H 15/48. MCV 91. Platelets 228.  Chol 158, TG 186, HDL 50, LDL 71 HbA1C 5.5%   Review of Systems  Cardiovascular:  Positive for dyspnea on exertion, leg swelling and paroxysmal nocturnal dyspnea. Negative for chest pain, palpitations and syncope.        Vitals:   07/16/21 1129 07/16/21 1133  BP: (!) 165/114 (!) 170/103  Pulse: 83 80  Resp: 16   Temp: 97.8 F (36.6 C)   SpO2: 97%      Body mass index is 45.68 kg/m. Filed Weights   07/16/21 1129  Weight:  283 lb (128.4 kg)     Objective:   Physical Exam Vitals and nursing note reviewed.  Constitutional:      General: She is not in acute distress.    Appearance: She is obese.  Neck:     Vascular: No JVD.  Cardiovascular:     Rate and Rhythm: Normal rate and regular rhythm.     Heart sounds: Normal heart sounds. No murmur heard. Pulmonary:     Effort: Pulmonary effort is normal.     Breath sounds: Normal  breath sounds. No wheezing or rales.  Musculoskeletal:     Right lower leg: Edema (2+) present.     Left lower leg: Edema (2+) present.        Assessment & Recommendations:   50 y.o. 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 Added spironolactone 50 mg daily. Check BMP, BP and echocardiogram in 1 week  Hypertension: No renal artery stenosis on renal artery duplex (08/2019). Conitnue coreg to 25 mg bid. Given hypertension, I do not think this needs to be discontinued in spite of acute on chronic heart failure. Added Spironolactone to 50 mg daiy. Counseled on low salt diet.   Troponin elevation: Likely type 2 MI in the setting of acute heart failure. In absence of chest pain, abnormal EKG findings likely due to hypertensive cardiomyopathy   Reviewed recent records, and independently interpreted the findings.   F/u in 2 weeks  Jessica Mormon, MD [facility] [place] Pager: [phone] Office: [phone] If no answer Cell [phone]

## 2021-07-16 ENCOUNTER — Ambulatory Visit: Payer: BC Managed Care – PPO | Admitting: Cardiology

## 2021-07-16 ENCOUNTER — Other Ambulatory Visit: Payer: Self-pay

## 2021-07-16 ENCOUNTER — Encounter: Payer: Self-pay | Admitting: Cardiology

## 2021-07-16 VITALS — BP 170/103 | HR 80 | Temp 97.8°F | Resp 16 | Ht 66.0 in | Wt 283.0 lb

## 2021-07-16 DIAGNOSIS — R7989 Other specified abnormal findings of blood chemistry: Secondary | ICD-10-CM | POA: Insufficient documentation

## 2021-07-16 DIAGNOSIS — R0600 Dyspnea, unspecified: Secondary | ICD-10-CM

## 2021-07-16 DIAGNOSIS — I5030 Unspecified diastolic (congestive) heart failure: Secondary | ICD-10-CM | POA: Insufficient documentation

## 2021-07-16 DIAGNOSIS — R778 Other specified abnormalities of plasma proteins: Secondary | ICD-10-CM

## 2021-07-16 DIAGNOSIS — R0609 Other forms of dyspnea: Secondary | ICD-10-CM

## 2021-07-16 DIAGNOSIS — I5033 Acute on chronic diastolic (congestive) heart failure: Secondary | ICD-10-CM

## 2021-07-16 DIAGNOSIS — I10 Essential (primary) hypertension: Secondary | ICD-10-CM

## 2021-07-16 MED ORDER — SPIRONOLACTONE 50 MG PO TABS: 50.0000 mg | ORAL_TABLET | Freq: Every day | ORAL | 2 refills | Status: DC

## 2021-07-20 ENCOUNTER — Telehealth: Payer: Self-pay | Admitting: Neurology

## 2021-07-20 ENCOUNTER — Telehealth: Payer: Self-pay | Admitting: Cardiology

## 2021-07-20 NOTE — Telephone Encounter (Signed)
Is this ok?

## 2021-07-20 NOTE — Telephone Encounter (Signed)
Pt wants to know if she can take both spironolactone & carvedilol in the morning. Currently takes spironolactone at [facility], but would prefer to take in AM in addition to the carvedilol.

## 2021-07-20 NOTE — Telephone Encounter (Signed)
Pt called wanting schedule an appt. She said something about another sleep study. Pt requesting a call back.

## 2021-07-20 NOTE — Telephone Encounter (Signed)
Yes

## 2021-07-21 LAB — BRAIN NATRIURETIC PEPTIDE: BNP: 254.6 pg/mL — ABNORMAL HIGH (ref 0.0–100.0)

## 2021-07-21 LAB — BASIC METABOLIC PANEL
BUN/Creatinine Ratio: 11 (ref 9–23)
BUN: 10 mg/dL (ref 6–24)
CO2: 19 mmol/L — ABNORMAL LOW (ref 20–29)
Calcium: 9.9 mg/dL (ref 8.7–10.2)
Chloride: 101 mmol/L (ref 96–106)
Creatinine, Ser: 0.92 mg/dL (ref 0.57–1.00)
Glucose: 98 mg/dL (ref 65–99)
Potassium: 4.2 mmol/L (ref 3.5–5.2)
Sodium: 136 mmol/L (ref 134–144)
eGFR: 77 mL/min/{1.73_m2} (ref >59)

## 2021-07-22 ENCOUNTER — Ambulatory Visit: Payer: BC Managed Care – PPO

## 2021-07-22 ENCOUNTER — Other Ambulatory Visit: Payer: Self-pay

## 2021-07-22 DIAGNOSIS — I5033 Acute on chronic diastolic (congestive) heart failure: Secondary | ICD-10-CM

## 2021-07-30 ENCOUNTER — Ambulatory Visit: Payer: BC Managed Care – PPO | Admitting: Cardiology

## 2021-07-30 ENCOUNTER — Encounter: Payer: Self-pay | Admitting: Cardiology

## 2021-07-30 ENCOUNTER — Other Ambulatory Visit: Payer: Self-pay

## 2021-07-30 VITALS — BP 158/91 | HR 71 | Temp 98.0°F | Resp 16 | Ht 66.0 in | Wt 281.0 lb

## 2021-07-30 DIAGNOSIS — I5033 Acute on chronic diastolic (congestive) heart failure: Secondary | ICD-10-CM

## 2021-07-30 DIAGNOSIS — I10 Essential (primary) hypertension: Secondary | ICD-10-CM

## 2021-07-30 MED ORDER — SACUBITRIL-VALSARTAN 49-51 MG PO TABS: 1.0000 | ORAL_TABLET | Freq: Two times a day (BID) | ORAL | Status: DC

## 2021-07-30 MED ORDER — EMPAGLIFLOZIN 10 MG PO TABS: 10.0000 mg | ORAL_TABLET | Freq: Every day | ORAL | 0 refills | Status: DC

## 2021-07-30 NOTE — Progress Notes (Signed)
Patient referred by Holland Commons, FNP for hypertension  Subjective:   Jessica [name], female    DOB: [birth year], 50 y.o.   MRN: [id number]  Chief Complaint  Patient presents with   Acute on chronic heart failure with preserved ejection fract   Follow-up     HPI  50 y.o. African-American female with hypertension, HFpEF  Leg edema, shortness of breath improved after starting Spironolactone 50 mg daily. Blood pressure remains elevated.   Current Outpatient Medications on File Prior to Visit  Medication Sig Dispense Refill   carvedilol (COREG) 25 MG tablet Take 1 tablet (25 mg total) by mouth 2 (two) times daily with a meal. 60 tablet 0   spironolactone (ALDACTONE) 50 MG tablet Take 1 tablet (50 mg total) by mouth daily. 30 tablet 2   Vitamin D, Ergocalciferol, (DRISDOL) 1.25 MG (50000 UT) CAPS capsule Take 1 capsule by mouth once a week.     No current facility-administered medications on file prior to visit.    Cardiovascular studies:  Echocardiogram 07/22/2021:  Left ventricle cavity is normal in size. Moderate concentric hypertrophy  of the left ventricle. Normal global wall motion. Normal LV systolic  function with EF 57%. Doppler evidence of grade III (restrictive)  diastolic dysfunction, elevated LAP.  Left atrial cavity is mildly dilated.  No significant valvular abnormality.  Compared to previous study on 3/81/8299, diastolic dysfunction has  progressed from grade II to grade III.  EKG 07/16/2021: Sinus rhythm 75 bpm Left atrial enlargement Left ventricular hypertrophy Lead loss V5 Lateral T wave inversion, consider ischemia    CTA chest 07/12/2021: 1. Technically adequate exam showing no acute pulmonary embolus. 2. Mild cardiomegaly.  No edema or acute pulmonary abnormality.  Renal artery duplex  09/04/2019: No evidence of renal artery occlusive disease in either renal artery. Normal velocity in bilateral renal arteries.  Normal intrarenal vascular  perfusion is noted in both kidneys. Normal kidney size bilaterally.  Normal study.   Echocardiogram 09/04/2019:  Normal LV systolic function with EF 55%. Left ventricle cavity is normal in size. Moderate concentric hypertrophy of the left ventricle. Normal global wall motion. Doppler evidence of grade II (pseudonormal) diastolic dysfunction. Diastolic dysfunction do not suggest elevated LA/LV endiastolic pressure. Left atrial cavity is mildly dilated at 4.1 cm. IVC is dilated with a respiratory response of <50%. This may suggest elevated right heart pressure   EKG 07/17/2019:  Sinus rhythm 84 bpm.  Inferolateral T wave inversions, consider ischemia.  Recent labs: 07/20/2021: Glucose 98, BUN/Cr 10/0.92. EGFR 77. Na/K 136/4.2.  BNP 254  07/12/2021: Glucose 108, BUN/Cr 8/0.80. EGFR >60. Na/K 135/3.7.  H/H 13/40. MCV 87. Platelets 312  Results for Jessica [name] (MRN [id number]) as of 07/15/2021 16:43  Ref. Range 07/12/2021 15:08 07/12/2021 15:45 07/12/2021 16:59  B Natriuretic Peptide Latest Ref Range: 0.0 - 100.0 pg/mL 161.9 (H)    Troponin I (High Sensitivity) Latest Ref Range: <18 ng/L 20 (H)  20 (H)    06/28/2019: Glucose 90. BUN/Cr 20/0.8., eGFR 74. Na/K 141/4.4 H/H 15/48. MCV 91. Platelets 228.  Chol 158, TG 186, HDL 50, LDL 71 HbA1C 5.5%   Review of Systems  Cardiovascular:  Positive for dyspnea on exertion, leg swelling and paroxysmal nocturnal dyspnea. Negative for chest pain, palpitations and syncope.        Vitals:   07/30/21 1452 07/30/21 1453  BP: (!) 195/104 (!) 158/91  Pulse: 75 71  Resp: 16   Temp: 98 F (36.7 C)  SpO2: 98%      Body mass index is 45.35 kg/m. Filed Weights   07/30/21 1452  Weight: 281 lb (127.5 kg)     Objective:   Physical Exam Vitals and nursing note reviewed.  Constitutional:      General: She is not in acute distress.    Appearance: She is obese.  Neck:     Vascular: No JVD.  Cardiovascular:     Rate and Rhythm: Normal rate  and regular rhythm.     Heart sounds: Normal heart sounds. No murmur heard. Pulmonary:     Effort: Pulmonary effort is normal.     Breath sounds: Normal breath sounds. No wheezing or rales.  Musculoskeletal:     Right lower leg: Edema (2+) present.     Left lower leg: Edema (2+) present.        Assessment & Recommendations:   50 y.o. African-American female with hypertension, HFpEF  HFpEF: Remains volume overloaded Continue spironolactone 50 mg daily. Start Entresto 49-51 mg bid and Jardiance 10 mg daily. Repeat BMP in 1 week. Down the road, I would hope to uptitrate Entresto and come off Coreg.  Hypertension: As above  F/u in 2 weeks w/Celeste Bee, PA  [place], MD [facility] [place] Pager: [phone] Office: [phone] If no answer Cell [phone]

## 2021-08-13 ENCOUNTER — Ambulatory Visit: Payer: BC Managed Care – PPO | Admitting: Student

## 2021-08-13 ENCOUNTER — Other Ambulatory Visit: Payer: Self-pay

## 2021-08-13 ENCOUNTER — Encounter: Payer: Self-pay | Admitting: Student

## 2021-08-13 VITALS — BP 158/90 | HR 64 | Temp 98.0°F | Ht 66.0 in | Wt 284.0 lb

## 2021-08-13 DIAGNOSIS — I10 Essential (primary) hypertension: Secondary | ICD-10-CM

## 2021-08-13 DIAGNOSIS — I5032 Chronic diastolic (congestive) heart failure: Secondary | ICD-10-CM

## 2021-08-13 MED ORDER — CARVEDILOL 25 MG PO TABS: 25.0000 mg | ORAL_TABLET | Freq: Two times a day (BID) | ORAL | 0 refills | Status: DC

## 2021-08-13 NOTE — Progress Notes (Signed)
Patient referred by Holland Commons, FNP for hypertension  Subjective:   Jessica [name], female    DOB: 07-Mar-1971, 50 y.o.   MRN: [id number]  Chief Complaint  Patient presents with   Acute on chronic heart failure with preserved ejection fract   Follow-up   Leg Swelling     HPI  50 y.o. African-American female with hypertension, HFpEF.   Patient presents for 2-week follow-up of HFpEF and hypertension.  At last office visit added Entresto 49/51 mg twice daily and Jardiance however patient did not start these medications as she was concerned to be taking multiple medications.  Patient reports she has been working on increasing physical activity and making diet and lifestyle modifications.  She does continue to have bilateral lower leg edema which is stable compared to last visit.   Current Outpatient Medications on File Prior to Visit  Medication Sig Dispense Refill   carvedilol (COREG) 25 MG tablet Take 1 tablet (25 mg total) by mouth 2 (two) times daily with a meal. 60 tablet 0   spironolactone (ALDACTONE) 50 MG tablet Take 1 tablet (50 mg total) by mouth daily. 30 tablet 2   Vitamin D, Ergocalciferol, (DRISDOL) 1.25 MG (50000 UT) CAPS capsule Take 1 capsule by mouth once a week.     empagliflozin (JARDIANCE) 10 MG TABS tablet Take 1 tablet (10 mg total) by mouth daily. (Patient not taking: Reported on 08/13/2021) 1 tablet 0   Current Facility-Administered Medications on File Prior to Visit  Medication Dose Route Frequency Provider Last Rate Last Admin   sacubitril-valsartan (ENTRESTO) 49-51 mg per tablet  1 tablet Oral BID Patwardhan, Reynold Bowen, MD        Cardiovascular studies:  Echocardiogram 07/22/2021:  Left ventricle cavity is normal in size. Moderate concentric hypertrophy  of the left ventricle. Normal global wall motion. Normal LV systolic  function with EF 57%. Doppler evidence of grade III (restrictive)  diastolic dysfunction, elevated LAP.  Left atrial cavity  is mildly dilated.  No significant valvular abnormality.  Compared to previous study on 8/65/7846, diastolic dysfunction has progressed from grade II to grade III.  EKG 07/16/2021: Sinus rhythm 75 bpm Left atrial enlargement Left ventricular hypertrophy Lead loss V5 Lateral T wave inversion, consider ischemia    CTA chest 07/12/2021: 1. Technically adequate exam showing no acute pulmonary embolus. 2. Mild cardiomegaly.  No edema or acute pulmonary abnormality.  Renal artery duplex  09/04/2019: No evidence of renal artery occlusive disease in either renal artery. Normal velocity in bilateral renal arteries.  Normal intrarenal vascular perfusion is noted in both kidneys. Normal kidney size bilaterally.  Normal study.   Echocardiogram 09/04/2019:  Normal LV systolic function with EF 55%. Left ventricle cavity is normal in size. Moderate concentric hypertrophy of the left ventricle. Normal global wall motion. Doppler evidence of grade II (pseudonormal) diastolic dysfunction. Diastolic dysfunction do not suggest elevated LA/LV endiastolic pressure. Left atrial cavity is mildly dilated at 4.1 cm. IVC is dilated with a respiratory response of <50%. This may suggest elevated right heart pressure   EKG 07/17/2019:  Sinus rhythm 84 bpm.  Inferolateral T wave inversions, consider ischemia.  Recent labs: 07/20/2021: Glucose 98, BUN/Cr 10/0.92. EGFR 77. Na/K 136/4.2.  BNP 254  07/12/2021: Glucose 108, BUN/Cr 8/0.80. EGFR >60. Na/K 135/3.7.  H/H 13/40. MCV 87. Platelets 312  Results for PANG, ROBERS (MRN [id number]) as of 07/15/2021 16:43  Ref. Range 07/12/2021 15:08 07/12/2021 15:45 07/12/2021 16:59  B Natriuretic Peptide Latest  Ref Range: 0.0 - 100.0 pg/mL 161.9 (H)    Troponin I (High Sensitivity) Latest Ref Range: <18 ng/L 20 (H)  20 (H)    06/28/2019: Glucose 90. BUN/Cr 20/0.8., eGFR 74. Na/K 141/4.4 H/H 15/48. MCV 91. Platelets 228.  Chol 158, TG 186, HDL 50, LDL 71 HbA1C 5.5%   Review  of Systems  Cardiovascular:  Positive for dyspnea on exertion, leg swelling (unchanged) and paroxysmal nocturnal dyspnea. Negative for chest pain, claudication, near-syncope, orthopnea, palpitations and syncope.  Neurological:  Negative for dizziness.        Vitals:   08/13/21 1624 08/13/21 1626  BP: (!) 172/105 (!) 158/90  Pulse: 64 64  Temp:    SpO2:       Body mass index is 45.84 kg/m. Filed Weights   08/13/21 1528  Weight: 284 lb (128.8 kg)     Objective:   Physical Exam Vitals and nursing note reviewed.  Constitutional:      General: She is not in acute distress.    Appearance: She is obese.  Neck:     Vascular: No JVD.  Cardiovascular:     Rate and Rhythm: Normal rate and regular rhythm.     Heart sounds: Normal heart sounds. No murmur heard. Pulmonary:     Effort: Pulmonary effort is normal.     Breath sounds: Normal breath sounds. No wheezing or rales.  Musculoskeletal:     Right lower leg: Edema (1-2+) present.     Left lower leg: Edema (1-2+) present.  Skin:    General: Skin is warm and dry.  Neurological:     General: No focal deficit present.      Assessment & Recommendations:   50 y.o. African-American female with hypertension, HFpEF  HFpEF: Discussed at length with patient regarding pathophysiology of HFpEF and benefit of guideline directed medical therapy.  Patient is now willing to try addition of Entresto and Jardiance. Patient remains volume overloaded on exam. Continue spironolactone 50 mg daily. Start Entresto 49-51 mg bid Start Jardiance 10 mg daily. Repeat BMP in 1 week. Could consider stopping Coreg in the future if patient's blood pressure is well controlled and she tolerates Entresto, spironolactone, and Jardiance.  Hypertension: Remains uncontrolled, management as above. Patient will continue to monitor daily at home and bring a written log to her next appointment.  Follow up in 2 weeks, sooner if needed, for hypertension and  HFpEF   Alethia Berthold, PA-C 08/13/2021, 4:32 PM Office: [phone]

## 2021-08-18 NOTE — Progress Notes (Signed)
External labs: 08/17/2021: BUN 12, creatinine 0.94, GFR >60, glucose 95, potassium 4.4, sodium 138 Total cholesterol 277, HDL 44, LDL 201, triglycerides 160 Vitamin D 28.7  12/03/2020: Sodium 143, glucose 23, BUN 15, creatinine 1.0, GFR >60, potassium 4.2 Total cholesterol 256, triglycerides 132, HDL 44, LDL 186 Hgb 13.6, HCT 40.8, platelet 292 TSH 0.44

## 2021-08-18 NOTE — Progress Notes (Signed)
Will discuss at upcoming OV

## 2021-08-25 NOTE — Progress Notes (Signed)
Patient referred by Holland Commons, FNP for hypertension  Subjective:   Jessica [name], female    DOB: 04-18-1971, 50 y.o.   MRN: [id number]  Chief Complaint  Patient presents with   Chronic heart failure with preserved ejection fraction    Follow-up     HPI  50 y.o. African-American female with hypertension, HFpEF.   Patient presents for 2-week follow-up of hypertension and HFpEF.  At last office visit started on Entresto 49/51 mg twice daily and Jardiance 10 mg daily, however patient has not started these medications due to concerns regarding side effects.  Patient notably is resistant to additional medications despite recognizing benefits of guideline directed medical therapy for HFpEF.  Patient's blood pressure is elevated in the office today, however she was recently seen by PCP on 08/17/2021 at which time blood pressure was 138/74 mmHg.  Patient is feeling relatively well overall and remains asymptomatic from a cardiovascular standpoint.  Leg swelling has improved since last office visit with use of support stockings.  Patient has lost approximately 10 pounds since last office visit, she is congratulated on this treatment.  Current Outpatient Medications on File Prior to Visit  Medication Sig Dispense Refill   atorvastatin (LIPITOR) 40 MG tablet Take 40 mg by mouth daily.     carvedilol (COREG) 25 MG tablet Take 1 tablet (25 mg total) by mouth 2 (two) times daily with a meal. 180 tablet 0   spironolactone (ALDACTONE) 50 MG tablet Take 1 tablet (50 mg total) by mouth daily. 30 tablet 2   Vitamin D, Ergocalciferol, (DRISDOL) 1.25 MG (50000 UT) CAPS capsule Take 1 capsule by mouth once a week.     No current facility-administered medications on file prior to visit.    Cardiovascular studies:  Echocardiogram 07/22/2021:  Left ventricle cavity is normal in size. Moderate concentric hypertrophy  of the left ventricle. Normal global wall motion. Normal LV systolic  function  with EF 57%. Doppler evidence of grade III (restrictive)  diastolic dysfunction, elevated LAP.  Left atrial cavity is mildly dilated.  No significant valvular abnormality.  Compared to previous study on 07/23/5630, diastolic dysfunction has progressed from grade II to grade III.  EKG 07/16/2021: Sinus rhythm 75 bpm Left atrial enlargement Left ventricular hypertrophy Lead loss V5 Lateral T wave inversion, consider ischemia    CTA chest 07/12/2021: 1. Technically adequate exam showing no acute pulmonary embolus. 2. Mild cardiomegaly.  No edema or acute pulmonary abnormality.  Renal artery duplex  09/04/2019: No evidence of renal artery occlusive disease in either renal artery. Normal velocity in bilateral renal arteries.  Normal intrarenal vascular perfusion is noted in both kidneys. Normal kidney size bilaterally.  Normal study.   Echocardiogram 09/04/2019:  Normal LV systolic function with EF 55%. Left ventricle cavity is normal in size. Moderate concentric hypertrophy of the left ventricle. Normal global wall motion. Doppler evidence of grade II (pseudonormal) diastolic dysfunction. Diastolic dysfunction do not suggest elevated LA/LV endiastolic pressure. Left atrial cavity is mildly dilated at 4.1 cm. IVC is dilated with a respiratory response of <50%. This may suggest elevated right heart pressure   EKG 07/17/2019:  Sinus rhythm 84 bpm.  Inferolateral T wave inversions, consider ischemia.  Recent labs:  07/20/2021: Glucose 98, BUN/Cr 10/0.92. EGFR 77. Na/K 136/4.2.  BNP 254  07/12/2021: Glucose 108, BUN/Cr 8/0.80. EGFR >60. Na/K 135/3.7.  H/H 13/40. MCV 87. Platelets 312  Results for CAMRYNN, MCCLINTIC (MRN [id number]) as of 07/15/2021 16:43  Ref. Range  07/12/2021 15:08 07/12/2021 15:45 07/12/2021 16:59  B Natriuretic Peptide Latest Ref Range: 0.0 - 100.0 pg/mL 161.9 (H)    Troponin I (High Sensitivity) Latest Ref Range: <18 ng/L 20 (H)  20 (H)    06/28/2019: Glucose 90. BUN/Cr  20/0.8., eGFR 74. Na/K 141/4.4 H/H 15/48. MCV 91. Platelets 228.  Chol 158, TG 186, HDL 50, LDL 71 HbA1C 5.5%   Review of Systems  Constitutional: Positive for weight loss. Negative for malaise/fatigue and weight gain.  Cardiovascular:  Positive for dyspnea on exertion, leg swelling (improved) and paroxysmal nocturnal dyspnea. Negative for chest pain, claudication, near-syncope, orthopnea, palpitations and syncope.  Respiratory:  Negative for shortness of breath.   Neurological:  Negative for dizziness.        Vitals:   08/27/21 1520 08/27/21 1535  BP: (!) 172/102 (!) 152/98  Pulse:    Resp:    SpO2:       Body mass index is 44.87 kg/m. Filed Weights   08/27/21 1518  Weight: 278 lb (126.1 kg)     Objective:   Physical Exam Vitals and nursing note reviewed.  Constitutional:      General: She is not in acute distress.    Appearance: She is obese.  Neck:     Vascular: No JVD.  Cardiovascular:     Rate and Rhythm: Normal rate and regular rhythm.     Heart sounds: Normal heart sounds. No murmur heard. Pulmonary:     Effort: Pulmonary effort is normal.     Breath sounds: Normal breath sounds. No wheezing or rales.  Musculoskeletal:     Right lower leg: Edema (minimal) present.     Left lower leg: Edema (minimal) present.  Skin:    General: Skin is warm and dry.  Neurological:     General: No focal deficit present.     Cranial Nerves: No cranial nerve deficit.     Motor: No weakness.      Assessment & Recommendations:   50 y.o. African-American female with hypertension, HFpEF  HFpEF: Discussed at length with patient regarding pathophysiology of HFpEF and benefit of guideline directed medical therapy.  However, despite recognizing benefits of guideline directed medical therapy, patient remains resistant to additional medication initiation or adjustment.   Again discussed with patient Delene Loll and Jardiance as well as olmesartan, due to concern for side effects  she refuses to take any of these medications. Patient is willing to take hydralazine 50 mg twice daily with additional doses as needed for blood pressure >140/90 mmHg, will start this.  Continue carvedilol and spironolactone. No clinical evidence of acute heart failure at this time.  Counseled patient regarding signs and symptoms that would warrant urgent or emergent evaluation.  Hypertension: Remains uncontrolled, management as above. Patient will continue to monitor daily at home and bring a written log to her next appointment. Notably patient remains resistant to additional medications for hypertension as well as HFpEF No evidence of neurologic deficits on exam.   Follow up in 3 weeks, sooner if needed, for hypertension and HFpEF   Alethia Berthold, PA-C 08/27/2021, 4:00 PM Office: [phone]

## 2021-08-27 ENCOUNTER — Ambulatory Visit: Payer: BC Managed Care – PPO | Admitting: Student

## 2021-08-27 ENCOUNTER — Encounter: Payer: Self-pay | Admitting: Student

## 2021-08-27 ENCOUNTER — Other Ambulatory Visit: Payer: Self-pay

## 2021-08-27 VITALS — BP 152/98 | HR 83 | Resp 16 | Ht 66.0 in | Wt 278.0 lb

## 2021-08-27 DIAGNOSIS — I5032 Chronic diastolic (congestive) heart failure: Secondary | ICD-10-CM

## 2021-08-27 DIAGNOSIS — I10 Essential (primary) hypertension: Secondary | ICD-10-CM

## 2021-08-27 MED ORDER — HYDRALAZINE HCL 50 MG PO TABS
50.0000 mg | ORAL_TABLET | Freq: Two times a day (BID) | ORAL | 3 refills | Status: DC
Start: 2021-08-27 — End: 2021-10-08

## 2021-09-08 ENCOUNTER — Ambulatory Visit: Payer: BC Managed Care – PPO | Admitting: Neurology

## 2021-09-08 ENCOUNTER — Telehealth: Payer: Self-pay | Admitting: *Deleted

## 2021-09-08 ENCOUNTER — Encounter: Payer: Self-pay | Admitting: Neurology

## 2021-09-08 NOTE — Telephone Encounter (Signed)
Pt no showed f/u appt today.  

## 2021-09-17 ENCOUNTER — Ambulatory Visit: Payer: BC Managed Care – PPO | Admitting: Student

## 2021-09-17 NOTE — Progress Notes (Deleted)
Patient referred by Holland Commons, FNP for hypertension  Subjective:   Jessica [name], female    DOB: 06/14/1971, 50 y.o.   MRN: [id number]  No chief complaint on file.    HPI  50 y.o. African-American female with hypertension, HFpEF.   Patient presents for 3-week follow-up of hypertension and HFpEF.  Patient has been resistant to titration of guideline directed medical therapy.  However at last visit patient was willing to try hydralazine 50 mg twice daily with additional doses as needed.***  ***  Patient presents for 2-week follow-up of hypertension and HFpEF.  At last office visit started on Entresto 49/51 mg twice daily and Jardiance 10 mg daily, however patient has not started these medications due to concerns regarding side effects.  Patient notably is resistant to additional medications despite recognizing benefits of guideline directed medical therapy for HFpEF.  Patient's blood pressure is elevated in the office today, however she was recently seen by PCP on 08/17/2021 at which time blood pressure was 138/74 mmHg.  Patient is feeling relatively well overall and remains asymptomatic from a cardiovascular standpoint.  Leg swelling has improved since last office visit with use of support stockings.  Patient has lost approximately 10 pounds since last office visit, she is congratulated on this treatment.  Current Outpatient Medications on File Prior to Visit  Medication Sig Dispense Refill   atorvastatin (LIPITOR) 40 MG tablet Take 40 mg by mouth daily.     carvedilol (COREG) 25 MG tablet Take 1 tablet (25 mg total) by mouth 2 (two) times daily with a meal. 180 tablet 0   hydrALAZINE (APRESOLINE) 50 MG tablet Take 1 tablet (50 mg total) by mouth 2 (two) times daily. With additional doses as needed for BP >140/90 mmHg. Maximum of 4 doses per day. 90 tablet 3   spironolactone (ALDACTONE) 50 MG tablet Take 1 tablet (50 mg total) by mouth daily. 30 tablet 2   Vitamin D,  Ergocalciferol, (DRISDOL) 1.25 MG (50000 UT) CAPS capsule Take 1 capsule by mouth once a week.     No current facility-administered medications on file prior to visit.    Cardiovascular studies:  Echocardiogram 07/22/2021:  Left ventricle cavity is normal in size. Moderate concentric hypertrophy  of the left ventricle. Normal global wall motion. Normal LV systolic  function with EF 57%. Doppler evidence of grade III (restrictive)  diastolic dysfunction, elevated LAP.  Left atrial cavity is mildly dilated.  No significant valvular abnormality.  Compared to previous study on 0/63/0160, diastolic dysfunction has progressed from grade II to grade III.  EKG 07/16/2021: Sinus rhythm 75 bpm Left atrial enlargement Left ventricular hypertrophy Lead loss V5 Lateral T wave inversion, consider ischemia    CTA chest 07/12/2021: 1. Technically adequate exam showing no acute pulmonary embolus. 2. Mild cardiomegaly.  No edema or acute pulmonary abnormality.  Renal artery duplex  09/04/2019: No evidence of renal artery occlusive disease in either renal artery. Normal velocity in bilateral renal arteries.  Normal intrarenal vascular perfusion is noted in both kidneys. Normal kidney size bilaterally.  Normal study.   Echocardiogram 09/04/2019:  Normal LV systolic function with EF 55%. Left ventricle cavity is normal in size. Moderate concentric hypertrophy of the left ventricle. Normal global wall motion. Doppler evidence of grade II (pseudonormal) diastolic dysfunction. Diastolic dysfunction do not suggest elevated LA/LV endiastolic pressure. Left atrial cavity is mildly dilated at 4.1 cm. IVC is dilated with a respiratory response of <50%. This may suggest elevated right  heart pressure   EKG 07/17/2019:  Sinus rhythm 84 bpm.  Inferolateral T wave inversions, consider ischemia.  Recent labs: CMP Latest Ref Rng & Units 07/20/2021 07/12/2021 04/23/2021  Glucose 65 - 99 mg/dL 98 108(H) 104(H)  BUN 6 -  24 mg/dL _0 Creatinine 0.57 - 1.00 mg/dL 0.92 0.80 0.85  Sodium 134 - 144 mmol/L 136 135 133(L)  Potassium 3.5 - 5.2 mmol/L 4.2 3.7 3.7  Chloride 96 - 106 mmol/L 101 100 97(L)  CO2 20 - 29 mmol/L 19(L) 25 26  Calcium 8.7 - 10.2 mg/dL 9.9 9.2 9.4  Total Protein 6.5 - 8.1 g/dL - - 8.2(H)  Total Bilirubin 0.3 - 1.2 mg/dL - - 1.5(H)  Alkaline Phos 38 - 126 U/L - - 72  AST 15 - 41 U/L - - 24  ALT 0 - 44 U/L - - 23   CBC Latest Ref Rng & Units 07/12/2021 04/23/2021  WBC 4.0 - 10.5 K/uL 10.5 12.0(H)  Hemoglobin 12.0 - 15.0 g/dL 13.6 13.2  Hematocrit 36.0 - 46.0 % 40.1 39.7  Platelets 150 - 400 K/uL 312 320   Lipid Panel  No results found for: CHOL, TRIG, HDL, CHOLHDL, VLDL, LDLCALC, LDLDIRECT HEMOGLOBIN A1C No results found for: HGBA1C, MPG TSH No results for input(s): TSH in the last 8760 hours.  Other Labs:  07/20/2021: Glucose 98, BUN/Cr 10/0.92. EGFR 77. Na/K 136/4.2.  BNP 254  07/12/2021: Glucose 108, BUN/Cr 8/0.80. EGFR >60. Na/K 135/3.7.  H/H 13/40. MCV 87. Platelets 312  Results for ELISAVET, BUEHRER (MRN [id number]) as of 07/15/2021 16:43  Ref. Range 07/12/2021 15:08 07/12/2021 15:45 07/12/2021 16:59  B Natriuretic Peptide Latest Ref Range: 0.0 - 100.0 pg/mL 161.9 (H)    Troponin I (High Sensitivity) Latest Ref Range: <18 ng/L 20 (H)  20 (H)    06/28/2019: Glucose 90. BUN/Cr 20/0.8., eGFR 74. Na/K 141/4.4 H/H 15/48. MCV 91. Platelets 228.  Chol 158, TG 186, HDL 50, LDL 71 HbA1C 5.5%   Review of Systems  Constitutional: Positive for weight loss. Negative for malaise/fatigue and weight gain.  Cardiovascular:  Positive for dyspnea on exertion, leg swelling (improved) and paroxysmal nocturnal dyspnea. Negative for chest pain, claudication, near-syncope, orthopnea, palpitations and syncope.  Respiratory:  Negative for shortness of breath.   Neurological:  Negative for dizziness.        There were no vitals filed for this visit.    There is no height or weight on file  to calculate BMI. There were no vitals filed for this visit.    Objective:   Physical Exam Vitals and nursing note reviewed.  Constitutional:      General: She is not in acute distress.    Appearance: She is obese.  Neck:     Vascular: No JVD.  Cardiovascular:     Rate and Rhythm: Normal rate and regular rhythm.     Heart sounds: Normal heart sounds. No murmur heard. Pulmonary:     Effort: Pulmonary effort is normal.     Breath sounds: Normal breath sounds. No wheezing or rales.  Musculoskeletal:     Right lower leg: Edema (minimal) present.     Left lower leg: Edema (minimal) present.  Skin:    General: Skin is warm and dry.  Neurological:     General: No focal deficit present.     Cranial Nerves: No cranial nerve deficit.     Motor: No weakness.      Assessment & Recommendations:   50  y.o. African-American female with hypertension, HFpEF *** HFpEF: Discussed at length with patient regarding pathophysiology of HFpEF and benefit of guideline directed medical therapy.  However, despite recognizing benefits of guideline directed medical therapy, patient remains resistant to additional medication initiation or adjustment.   Again discussed with patient Delene Loll and Jardiance as well as olmesartan, due to concern for side effects she refuses to take any of these medications. Patient is willing to take hydralazine 50 mg twice daily with additional doses as needed for blood pressure >140/90 mmHg, will start this.  Continue carvedilol and spironolactone. No clinical evidence of acute heart failure at this time.  Counseled patient regarding signs and symptoms that would warrant urgent or emergent evaluation.  Hypertension: Remains uncontrolled, management as above. Patient will continue to monitor daily at home and bring a written log to her next appointment. Notably patient remains resistant to additional medications for hypertension as well as HFpEF No evidence of neurologic  deficits on exam.   Follow up in 3 weeks, sooner if needed, for hypertension and HFpEF   Alethia Berthold, PA-C 09/17/2021, 12:40 PM Office: [phone]

## 2021-09-23 ENCOUNTER — Ambulatory Visit: Payer: BC Managed Care – PPO | Admitting: Student

## 2021-09-23 NOTE — Progress Notes (Deleted)
Patient referred by Holland Commons, FNP for hypertension  Subjective:   Jessica [name], female    DOB: 06/14/1971, 50 y.o.   MRN: [id number]  No chief complaint on file.    HPI  50 y.o. African-American female with hypertension, HFpEF.   Patient presents for 3-week follow-up of hypertension and HFpEF.  Patient has been resistant to titration of guideline directed medical therapy.  However at last visit patient was willing to try hydralazine 50 mg twice daily with additional doses as needed.***  ***  Patient presents for 2-week follow-up of hypertension and HFpEF.  At last office visit started on Entresto 49/51 mg twice daily and Jardiance 10 mg daily, however patient has not started these medications due to concerns regarding side effects.  Patient notably is resistant to additional medications despite recognizing benefits of guideline directed medical therapy for HFpEF.  Patient's blood pressure is elevated in the office today, however she was recently seen by PCP on 08/17/2021 at which time blood pressure was 138/74 mmHg.  Patient is feeling relatively well overall and remains asymptomatic from a cardiovascular standpoint.  Leg swelling has improved since last office visit with use of support stockings.  Patient has lost approximately 10 pounds since last office visit, she is congratulated on this treatment.  Current Outpatient Medications on File Prior to Visit  Medication Sig Dispense Refill   atorvastatin (LIPITOR) 40 MG tablet Take 40 mg by mouth daily.     carvedilol (COREG) 25 MG tablet Take 1 tablet (25 mg total) by mouth 2 (two) times daily with a meal. 180 tablet 0   hydrALAZINE (APRESOLINE) 50 MG tablet Take 1 tablet (50 mg total) by mouth 2 (two) times daily. With additional doses as needed for BP >140/90 mmHg. Maximum of 4 doses per day. 90 tablet 3   spironolactone (ALDACTONE) 50 MG tablet Take 1 tablet (50 mg total) by mouth daily. 30 tablet 2   Vitamin D,  Ergocalciferol, (DRISDOL) 1.25 MG (50000 UT) CAPS capsule Take 1 capsule by mouth once a week.     No current facility-administered medications on file prior to visit.    Cardiovascular studies:  Echocardiogram 07/22/2021:  Left ventricle cavity is normal in size. Moderate concentric hypertrophy  of the left ventricle. Normal global wall motion. Normal LV systolic  function with EF 57%. Doppler evidence of grade III (restrictive)  diastolic dysfunction, elevated LAP.  Left atrial cavity is mildly dilated.  No significant valvular abnormality.  Compared to previous study on 0/63/0160, diastolic dysfunction has progressed from grade II to grade III.  EKG 07/16/2021: Sinus rhythm 75 bpm Left atrial enlargement Left ventricular hypertrophy Lead loss V5 Lateral T wave inversion, consider ischemia    CTA chest 07/12/2021: 1. Technically adequate exam showing no acute pulmonary embolus. 2. Mild cardiomegaly.  No edema or acute pulmonary abnormality.  Renal artery duplex  09/04/2019: No evidence of renal artery occlusive disease in either renal artery. Normal velocity in bilateral renal arteries.  Normal intrarenal vascular perfusion is noted in both kidneys. Normal kidney size bilaterally.  Normal study.   Echocardiogram 09/04/2019:  Normal LV systolic function with EF 55%. Left ventricle cavity is normal in size. Moderate concentric hypertrophy of the left ventricle. Normal global wall motion. Doppler evidence of grade II (pseudonormal) diastolic dysfunction. Diastolic dysfunction do not suggest elevated LA/LV endiastolic pressure. Left atrial cavity is mildly dilated at 4.1 cm. IVC is dilated with a respiratory response of <50%. This may suggest elevated right  heart pressure   EKG 07/17/2019:  Sinus rhythm 84 bpm.  Inferolateral T wave inversions, consider ischemia.  Recent labs: CMP Latest Ref Rng & Units 07/20/2021 07/12/2021 04/23/2021  Glucose 65 - 99 mg/dL 98 108(H) 104(H)  BUN 6 -  24 mg/dL _0 Creatinine 0.57 - 1.00 mg/dL 0.92 0.80 0.85  Sodium 134 - 144 mmol/L 136 135 133(L)  Potassium 3.5 - 5.2 mmol/L 4.2 3.7 3.7  Chloride 96 - 106 mmol/L 101 100 97(L)  CO2 20 - 29 mmol/L 19(L) 25 26  Calcium 8.7 - 10.2 mg/dL 9.9 9.2 9.4  Total Protein 6.5 - 8.1 g/dL - - 8.2(H)  Total Bilirubin 0.3 - 1.2 mg/dL - - 1.5(H)  Alkaline Phos 38 - 126 U/L - - 72  AST 15 - 41 U/L - - 24  ALT 0 - 44 U/L - - 23   CBC Latest Ref Rng & Units 07/12/2021 04/23/2021  WBC 4.0 - 10.5 K/uL 10.5 12.0(H)  Hemoglobin 12.0 - 15.0 g/dL 13.6 13.2  Hematocrit 36.0 - 46.0 % 40.1 39.7  Platelets 150 - 400 K/uL 312 320   Lipid Panel  No results found for: CHOL, TRIG, HDL, CHOLHDL, VLDL, LDLCALC, LDLDIRECT HEMOGLOBIN A1C No results found for: HGBA1C, MPG TSH No results for input(s): TSH in the last 8760 hours.  Other Labs:  07/20/2021: Glucose 98, BUN/Cr 10/0.92. EGFR 77. Na/K 136/4.2.  BNP 254  07/12/2021: Glucose 108, BUN/Cr 8/0.80. EGFR >60. Na/K 135/3.7.  H/H 13/40. MCV 87. Platelets 312  Results for ELISAVET, BUEHRER (MRN [id number]) as of 07/15/2021 16:43  Ref. Range 07/12/2021 15:08 07/12/2021 15:45 07/12/2021 16:59  B Natriuretic Peptide Latest Ref Range: 0.0 - 100.0 pg/mL 161.9 (H)    Troponin I (High Sensitivity) Latest Ref Range: <18 ng/L 20 (H)  20 (H)    06/28/2019: Glucose 90. BUN/Cr 20/0.8., eGFR 74. Na/K 141/4.4 H/H 15/48. MCV 91. Platelets 228.  Chol 158, TG 186, HDL 50, LDL 71 HbA1C 5.5%   Review of Systems  Constitutional: Positive for weight loss. Negative for malaise/fatigue and weight gain.  Cardiovascular:  Positive for dyspnea on exertion, leg swelling (improved) and paroxysmal nocturnal dyspnea. Negative for chest pain, claudication, near-syncope, orthopnea, palpitations and syncope.  Respiratory:  Negative for shortness of breath.   Neurological:  Negative for dizziness.        There were no vitals filed for this visit.    There is no height or weight on file  to calculate BMI. There were no vitals filed for this visit.    Objective:   Physical Exam Vitals and nursing note reviewed.  Constitutional:      General: She is not in acute distress.    Appearance: She is obese.  Neck:     Vascular: No JVD.  Cardiovascular:     Rate and Rhythm: Normal rate and regular rhythm.     Heart sounds: Normal heart sounds. No murmur heard. Pulmonary:     Effort: Pulmonary effort is normal.     Breath sounds: Normal breath sounds. No wheezing or rales.  Musculoskeletal:     Right lower leg: Edema (minimal) present.     Left lower leg: Edema (minimal) present.  Skin:    General: Skin is warm and dry.  Neurological:     General: No focal deficit present.     Cranial Nerves: No cranial nerve deficit.     Motor: No weakness.      Assessment & Recommendations:   50  y.o. African-American female with hypertension, HFpEF *** HFpEF: Discussed at length with patient regarding pathophysiology of HFpEF and benefit of guideline directed medical therapy.  However, despite recognizing benefits of guideline directed medical therapy, patient remains resistant to additional medication initiation or adjustment.   Again discussed with patient Delene Loll and Jardiance as well as olmesartan, due to concern for side effects she refuses to take any of these medications. Patient is willing to take hydralazine 50 mg twice daily with additional doses as needed for blood pressure >140/90 mmHg, will start this.  Continue carvedilol and spironolactone. No clinical evidence of acute heart failure at this time.  Counseled patient regarding signs and symptoms that would warrant urgent or emergent evaluation.  Hypertension: Remains uncontrolled, management as above. Patient will continue to monitor daily at home and bring a written log to her next appointment. Notably patient remains resistant to additional medications for hypertension as well as HFpEF No evidence of neurologic  deficits on exam.   Follow up in 3 weeks, sooner if needed, for hypertension and HFpEF   Alethia Berthold, PA-C 09/23/2021, 2:11 PM Office: [phone]

## 2021-10-05 ENCOUNTER — Telehealth: Payer: Self-pay | Admitting: Student

## 2021-10-05 NOTE — Telephone Encounter (Signed)
Patient wants to discuss carvedilol medication with medical assistant. She is having some issues with her meds and believes it is the carvedilol.

## 2021-10-06 ENCOUNTER — Telehealth: Payer: Self-pay

## 2021-10-06 NOTE — Telephone Encounter (Signed)
Pt called back and stated that she gets nauseous the morning after she takes her carvedilol. She stated she also gets slight dizziness as well. She said this does not happen when she does not take it. She was not able to give me any BP reading. She said she can check it the next time it happens. She thinks it may be the carvedilol or the spironolactone. Please advise.

## 2021-10-06 NOTE — Telephone Encounter (Signed)
Called pt, pt was at work. Pt will call back and give [place] more information later.

## 2021-10-07 NOTE — Telephone Encounter (Signed)
Please advise patient to take half of her carvedilol (12.5 mg) today and tomorrow prior to next office visit.  We will discuss further at office visit tomorrow.

## 2021-10-07 NOTE — Progress Notes (Signed)
Patient referred by Jessica Commons, FNP for hypertension  Subjective:   Jessica [name], female    DOB: 07/15/1971, 50 y.o.   MRN: [id number]  Chief Complaint  Patient presents with   Chronic heart failure with preserved ejection fraction    Follow-up   Hypertension     HPI  50 y.o. African-American female with hypertension, HFpEF.   Patient presents for 3-week follow-up of hypertension and HFpEF.  Patient has been resistant to titration of guideline directed medical therapy.  However at last visit patient was willing to try hydralazine 50 mg twice daily with additional doses as needed.  Patient called the office concerned that carvedilol was causing dizziness, advised her to reduce it from 25 mg to 12.5 mg twice daily yesterday. She now presents for follow up.  Unfortunately patient has been unable to reduce the dose because she could not swallow the pill.  Also unfortunately patient has not been monitoring home blood pressure readings.  Patient notably is resistant to additional medications despite recognizing benefits of guideline directed medical therapy for HFpEF.  Patient has lost an additional 3 pounds since last office visit.  She continues to have bilateral leg edema, which is stable.  Current Outpatient Medications on File Prior to Visit  Medication Sig Dispense Refill   atorvastatin (LIPITOR) 40 MG tablet Take 40 mg by mouth daily.     spironolactone (ALDACTONE) 50 MG tablet Take 1 tablet (50 mg total) by mouth daily. 30 tablet 2   Vitamin D, Ergocalciferol, (DRISDOL) 1.25 MG (50000 UT) CAPS capsule Take 1 capsule by mouth once a week.     No current facility-administered medications on file prior to visit.    Cardiovascular studies:  Echocardiogram 07/22/2021:  Left ventricle cavity is normal in size. Moderate concentric hypertrophy  of the left ventricle. Normal global wall motion. Normal LV systolic  function with EF 57%. Doppler evidence of grade III  (restrictive)  diastolic dysfunction, elevated LAP.  Left atrial cavity is mildly dilated.  No significant valvular abnormality.  Compared to previous study on 7/35/6701, diastolic dysfunction has progressed from grade II to grade III.  EKG 07/16/2021: Sinus rhythm 75 bpm Left atrial enlargement Left ventricular hypertrophy Lead loss V5 Lateral T wave inversion, consider ischemia   CTA chest 07/12/2021: 1. Technically adequate exam showing no acute pulmonary embolus. 2. Mild cardiomegaly.  No edema or acute pulmonary abnormality.  Renal artery duplex  09/04/2019: No evidence of renal artery occlusive disease in either renal artery. Normal velocity in bilateral renal arteries.  Normal intrarenal vascular perfusion is noted in both kidneys. Normal kidney size bilaterally.  Normal study.   Echocardiogram 09/04/2019:  Normal LV systolic function with EF 55%. Left ventricle cavity is normal in size. Moderate concentric hypertrophy of the left ventricle. Normal global wall motion. Doppler evidence of grade II (pseudonormal) diastolic dysfunction. Diastolic dysfunction do not suggest elevated LA/LV endiastolic pressure. Left atrial cavity is mildly dilated at 4.1 cm. IVC is dilated with a respiratory response of <50%. This may suggest elevated right heart pressure  EKG 07/17/2019:  Sinus rhythm 84 bpm.  Inferolateral T wave inversions, consider ischemia.  Recent labs: CMP Latest Ref Rng & Units 07/20/2021 07/12/2021 04/23/2021  Glucose 65 - 99 mg/dL 98 108(H) 104(H)  BUN 6 - 24 mg/dL _0 Creatinine 0.57 - 1.00 mg/dL 0.92 0.80 0.85  Sodium 134 - 144 mmol/L 136 135 133(L)  Potassium 3.5 - 5.2 mmol/L 4.2 3.7 3.7  Chloride  96 - 106 mmol/L 101 100 97(L)  CO2 20 - 29 mmol/L 19(L) 25 26  Calcium 8.7 - 10.2 mg/dL 9.9 9.2 9.4  Total Protein 6.5 - 8.1 g/dL - - 8.2(H)  Total Bilirubin 0.3 - 1.2 mg/dL - - 1.5(H)  Alkaline Phos 38 - 126 U/L - - 72  AST 15 - 41 U/L - - 24  ALT 0 - 44 U/L - - 23    CBC Latest Ref Rng & Units 07/12/2021 04/23/2021  WBC 4.0 - 10.5 K/uL 10.5 12.0(H)  Hemoglobin 12.0 - 15.0 g/dL 13.6 13.2  Hematocrit 36.0 - 46.0 % 40.1 39.7  Platelets 150 - 400 K/uL 312 320   Lipid Panel  No results found for: CHOL, TRIG, HDL, CHOLHDL, VLDL, LDLCALC, LDLDIRECT HEMOGLOBIN A1C No results found for: HGBA1C, MPG TSH No results for input(s): TSH in the last 8760 hours.  Other Labs:  07/20/2021: Glucose 98, BUN/Cr 10/0.92. EGFR 77. Na/K 136/4.2.  BNP 254  07/12/2021: Glucose 108, BUN/Cr 8/0.80. EGFR >60. Na/K 135/3.7.  H/H 13/40. MCV 87. Platelets 312  Results for Jessica, [name] (MRN [id number]) as of 07/15/2021 16:43  Ref. Range 07/12/2021 15:08 07/12/2021 15:45 07/12/2021 16:59  B Natriuretic Peptide Latest Ref Range: 0.0 - 100.0 pg/mL 161.9 (H)    Troponin I (High Sensitivity) Latest Ref Range: <18 ng/L 20 (H)  20 (H)    06/28/2019: Glucose 90. BUN/Cr 20/0.8., eGFR 74. Na/K 141/4.4 H/H 15/48. MCV 91. Platelets 228.  Chol 158, TG 186, HDL 50, LDL 71 HbA1C 5.5%   Review of Systems  Constitutional: Positive for weight loss. Negative for malaise/fatigue and weight gain.  Cardiovascular:  Positive for dyspnea on exertion (improving), leg swelling (stable) and paroxysmal nocturnal dyspnea. Negative for chest pain, claudication, near-syncope, orthopnea, palpitations and syncope.  Respiratory:  Negative for shortness of breath.   Neurological:  Negative for dizziness.        Vitals:   10/08/21 1538 10/08/21 1542  BP: (!) 158/92 (!) 158/88  Pulse: 72 72  Resp: 16   SpO2: 96% 96%     Body mass index is 44.48 kg/m. Filed Weights   10/08/21 1538  Weight: 275 lb 9.6 oz (125 kg)     Objective:   Physical Exam Vitals and nursing note reviewed.  Constitutional:      General: She is not in acute distress.    Appearance: She is obese.  Neck:     Vascular: No JVD.  Cardiovascular:     Rate and Rhythm: Normal rate and regular rhythm.     Heart sounds: Normal  heart sounds. No murmur heard. Pulmonary:     Effort: Pulmonary effort is normal.     Breath sounds: Normal breath sounds. No wheezing or rales.  Musculoskeletal:     Right lower leg: Edema (minimal) present.     Left lower leg: Edema (minimal) present.  Skin:    General: Skin is warm and dry.  Neurological:     General: No focal deficit present.     Cranial Nerves: No cranial nerve deficit.     Motor: No weakness.      Assessment & Recommendations:   50 y.o. African-American female with hypertension, HFpEF  HFpEF: Despite recognizing benefits of guideline directed medical therapy, patient remains resistant to additional medication initiation or adjustment.  Due to concern for side effects she refuses to take any of the following: Entresto, Jardiance, olmesartan.  Will reduce carvedilol from 25 mg to 12.5 mg twice daily given  dizziness Continue spironolactone and hydralazine 50 mg twice daily with additional doses for blood pressure >140/90 mmHg. No clinical evidence of acute heart failure at this time.  Counseled patient regarding signs and symptoms that would warrant urgent or emergent evaluation.  Hypertension: Remains uncontrolled, management as above. Patient will continue to monitor daily at home and bring a written log to her next appointment. Notably patient remains resistant to additional medications for hypertension as well as HFpEF  Patient appears motivated to make diet and lifestyle changes, particularly to continue working on weight loss which will improve cardiac risk and blood pressure control.  Follow-up in 3 months, sooner if needed, for hypertension and HFpEF.   Alethia Berthold, PA-C 10/09/2021, 3:03 PM Office: [phone]

## 2021-10-07 NOTE — Telephone Encounter (Signed)
Called and spoke to pt, pt voiced understanding. She already took a full tab of her carvedilol today. She will take half tomorrow before she comes in.

## 2021-10-08 ENCOUNTER — Ambulatory Visit: Payer: BC Managed Care – PPO | Admitting: Student

## 2021-10-08 ENCOUNTER — Encounter: Payer: Self-pay | Admitting: Student

## 2021-10-08 ENCOUNTER — Other Ambulatory Visit: Payer: Self-pay

## 2021-10-08 VITALS — BP 158/88 | HR 72 | Resp 16 | Ht 66.0 in | Wt 275.6 lb

## 2021-10-08 DIAGNOSIS — I5032 Chronic diastolic (congestive) heart failure: Secondary | ICD-10-CM

## 2021-10-08 DIAGNOSIS — I10 Essential (primary) hypertension: Secondary | ICD-10-CM

## 2021-10-08 MED ORDER — HYDRALAZINE HCL 50 MG PO TABS: 50.0000 mg | ORAL_TABLET | Freq: Three times a day (TID) | ORAL | 3 refills | Status: DC | PRN

## 2021-10-08 MED ORDER — CARVEDILOL 12.5 MG PO TABS: 12.5000 mg | ORAL_TABLET | Freq: Two times a day (BID) | ORAL | 3 refills | Status: DC

## 2021-10-09 ENCOUNTER — Other Ambulatory Visit: Payer: Self-pay | Admitting: Cardiology

## 2021-10-09 DIAGNOSIS — I5033 Acute on chronic diastolic (congestive) heart failure: Secondary | ICD-10-CM

## 2021-11-09 ENCOUNTER — Telehealth: Payer: Self-pay | Admitting: Student

## 2021-11-09 NOTE — Telephone Encounter (Signed)
Patient wants to know if hoarseness is a side effect of spironolactone, carvedilol or hydralazine. Please call her back to discuss, as she is experiencing hoarseness with her voice recently. [phone].

## 2021-11-12 NOTE — Telephone Encounter (Signed)
Error

## 2021-12-30 ENCOUNTER — Other Ambulatory Visit: Payer: Self-pay | Admitting: Student

## 2021-12-30 DIAGNOSIS — I5033 Acute on chronic diastolic (congestive) heart failure: Secondary | ICD-10-CM

## 2022-01-06 NOTE — Progress Notes (Signed)
Patient referred by Holland Commons, FNP for hypertension  Subjective:   Jessica [name], female    DOB: 1971/04/21, 51 y.o.   MRN: [id number]  Chief Complaint  Patient presents with   Chronic heart failure with preserved ejection fraction    Follow-up     HPI  51 y.o. African-American female with hypertension, HFpEF.   Patient presents for 49-monthfollow-up of hypertension and HFpEF.  She has notably been very resistant to up titration of guideline directed medical therapy despite multiple discussions regarding medication benefits and improvement of long-term outcomes.  Patient had previously been agreeable to taking hydralazine, however upon further questioning she never took this.  She has been taking carvedilol and spironolactone, as well as atorvastatin, only.  Patient does have bilateral leg edema which she reports is stable.  Denies orthopnea, PND, chest pain, dyspnea.  Current Outpatient Medications on File Prior to Visit  Medication Sig Dispense Refill   atorvastatin (LIPITOR) 40 MG tablet Take 40 mg by mouth daily.     carvedilol (COREG) 12.5 MG tablet Take 1 tablet (12.5 mg total) by mouth 2 (two) times daily with a meal. 60 tablet 3   spironolactone (ALDACTONE) 50 MG tablet TAKE 1 TABLET(50 MG) BY MOUTH DAILY 30 tablet 2   Vitamin D, Ergocalciferol, (DRISDOL) 1.25 MG (50000 UT) CAPS capsule Take 1 capsule by mouth once a week.     No current facility-administered medications on file prior to visit.    Cardiovascular studies: EKG 01/07/2022:  Sinus rhythm 75 bpm Left atrial enlargement Left ventricular hypertrophy with likely secondary ST-T changes Lateral T wave inversion Consider lateral and anterior ischemia   Echocardiogram 07/22/2021:  Left ventricle cavity is normal in size. Moderate concentric hypertrophy  of the left ventricle. Normal global wall motion. Normal LV systolic  function with EF 57%. Doppler evidence of grade III (restrictive)  diastolic  dysfunction, elevated LAP.  Left atrial cavity is mildly dilated.  No significant valvular abnormality.  Compared to previous study on 99/48/5462 diastolic dysfunction has progressed from grade II to grade III.  EKG 07/16/2021: Sinus rhythm 75 bpm Left atrial enlargement Left ventricular hypertrophy Lead loss V5 Lateral T wave inversion, consider ischemia   CTA chest 07/12/2021: 1. Technically adequate exam showing no acute pulmonary embolus. 2. Mild cardiomegaly.  No edema or acute pulmonary abnormality.  Renal artery duplex  09/04/2019: No evidence of renal artery occlusive disease in either renal artery. Normal velocity in bilateral renal arteries.  Normal intrarenal vascular perfusion is noted in both kidneys. Normal kidney size bilaterally.  Normal study.   Echocardiogram 09/04/2019:  Normal LV systolic function with EF 55%. Left ventricle cavity is normal in size. Moderate concentric hypertrophy of the left ventricle. Normal global wall motion. Doppler evidence of grade II (pseudonormal) diastolic dysfunction. Diastolic dysfunction do not suggest elevated LA/LV endiastolic pressure. Left atrial cavity is mildly dilated at 4.1 cm. IVC is dilated with a respiratory response of <50%. This may suggest elevated right heart pressure  EKG 07/17/2019:  Sinus rhythm 84 bpm.  Inferolateral T wave inversions, consider ischemia.  Recent labs: CMP Latest Ref Rng & Units 07/20/2021 07/12/2021 04/23/2021  Glucose 65 - 99 mg/dL 98 108(H) 104(H)  BUN 6 - 24 mg/dL '10 8 13  ' Creatinine 0.57 - 1.00 mg/dL 0.92 0.80 0.85  Sodium 134 - 144 mmol/L 136 135 133(L)  Potassium 3.5 - 5.2 mmol/L 4.2 3.7 3.7  Chloride 96 - 106 mmol/L 101 100 97(L)  CO2 20 -  29 mmol/L 19(L) 25 26  Calcium 8.7 - 10.2 mg/dL 9.9 9.2 9.4  Total Protein 6.5 - 8.1 g/dL - - 8.2(H)  Total Bilirubin 0.3 - 1.2 mg/dL - - 1.5(H)  Alkaline Phos 38 - 126 U/L - - 72  AST 15 - 41 U/L - - 24  ALT 0 - 44 U/L - - 23   CBC Latest Ref Rng &  Units 07/12/2021 04/23/2021  WBC 4.0 - 10.5 K/uL 10.5 12.0(H)  Hemoglobin 12.0 - 15.0 g/dL 13.6 13.2  Hematocrit 36.0 - 46.0 % 40.1 39.7  Platelets 150 - 400 K/uL 312 320   Lipid Panel  No results found for: CHOL, TRIG, HDL, CHOLHDL, VLDL, LDLCALC, LDLDIRECT HEMOGLOBIN A1C No results found for: HGBA1C, MPG TSH No results for input(s): TSH in the last 8760 hours.  Other Labs:  07/20/2021: Glucose 98, BUN/Cr 10/0.92. EGFR 77. Na/K 136/4.2.  BNP 254  07/12/2021: Glucose 108, BUN/Cr 8/0.80. EGFR >60. Na/K 135/3.7.  H/H 13/40. MCV 87. Platelets 312  Results for QUINTAVIA, ROGSTAD (MRN [id number]) as of 07/15/2021 16:43  Ref. Range 07/12/2021 15:08 07/12/2021 15:45 07/12/2021 16:59  B Natriuretic Peptide Latest Ref Range: 0.0 - 100.0 pg/mL 161.9 (H)    Troponin I (High Sensitivity) Latest Ref Range: <18 ng/L 20 (H)  20 (H)    06/28/2019: Glucose 90. BUN/Cr 20/0.8., eGFR 74. Na/K 141/4.4 H/H 15/48. MCV 91. Platelets 228.  Chol 158, TG 186, HDL 50, LDL 71 HbA1C 5.5%   Review of Systems  Constitutional: Positive for weight gain. Negative for malaise/fatigue.  Cardiovascular:  Positive for dyspnea on exertion and leg swelling (stable). Negative for chest pain, claudication, near-syncope, orthopnea, palpitations, paroxysmal nocturnal dyspnea and syncope.  Respiratory:  Negative for shortness of breath.   Neurological:  Negative for dizziness.        Vitals:   01/07/22 1508  BP: (!) 180/113  Pulse: 79  Temp: 97.6 F (36.4 C)  SpO2: 98%     Body mass index is 45.19 kg/m. Filed Weights   01/07/22 1508  Weight: 280 lb (127 kg)     Objective:   Physical Exam Vitals reviewed.  Constitutional:      General: She is not in acute distress.    Appearance: She is obese.  Neck:     Vascular: No JVD.  Cardiovascular:     Rate and Rhythm: Normal rate and regular rhythm.     Heart sounds: Normal heart sounds. No murmur heard. Pulmonary:     Effort: Pulmonary effort is normal.      Breath sounds: Normal breath sounds. No wheezing or rales.  Musculoskeletal:     Right lower leg: Edema (1+ pitting) present.     Left lower leg: Edema (1+ pitting) present.  Skin:    General: Skin is warm and dry.  Neurological:     General: No focal deficit present.     Cranial Nerves: No cranial nerve deficit.     Motor: No weakness.      Assessment & Recommendations:   51 y.o. African-American female with hypertension, HFpEF  HFpEF: Again discussed at length with patient regarding guideline directed medical therapy including SGLT2 inhibitors, Entresto, and BiDil.  Reviewed benefits of each of these medication's.  Patient verbalized understanding of the benefits of up titration of guideline directed medical therapy.  She also verbalized understanding of the risks of leaving blood pressure controlled and not aggressively uptitrating guideline directed medical therapy for heart failure, including recurrent hospitalizations and acute decompensated  heart failure. Despite recognition of the respective risks and benefits patient remains resistant to up titration of guideline directed medical therapy for heart failure or additional antihypertensive medications. After extensive discussion patient again states that she is willing to try hydralazine 50 mg 3 times daily with additional doses as needed for blood pressure >150/90 mmHg.  I have resent this to the pharmacy accordingly. No clinical evidence of heart failure at this time.  Counseled patient regarding signs and symptoms that would warrant urgent or emergent evaluation, she verbalized understanding agreement.  Hypertension: Blood pressure remains uncontrolled both at home and today in the office. Again had an extensive discussion regarding the importance of blood pressure management and the risks of uncontrolled pretension.  Patient remains hesitant to start any additional medications.  However after extensive discussion she is willing to try  hydralazine 50 mg 3 times daily, which should at least improve blood pressure management.  Again counseled patient regarding the importance of diet and lifestyle modifications as well as weight loss.   Follow up in 4 weeks, sooner if needed.    This was a 40-minute encounter with face-to-face counseling, medical records review, coordination of care, explanation of complex medical issues, complex medical decision making.     Alethia Berthold, PA-C 01/07/2022, 4:15 PM Office: [phone]

## 2022-01-07 ENCOUNTER — Encounter: Payer: Self-pay | Admitting: Student

## 2022-01-07 ENCOUNTER — Ambulatory Visit: Payer: BC Managed Care – PPO | Admitting: Student

## 2022-01-07 ENCOUNTER — Other Ambulatory Visit: Payer: Self-pay

## 2022-01-07 VITALS — BP 180/113 | HR 79 | Temp 97.6°F | Ht 66.0 in | Wt 280.0 lb

## 2022-01-07 DIAGNOSIS — I10 Essential (primary) hypertension: Secondary | ICD-10-CM

## 2022-01-07 DIAGNOSIS — I5032 Chronic diastolic (congestive) heart failure: Secondary | ICD-10-CM

## 2022-01-07 MED ORDER — HYDRALAZINE HCL 50 MG PO TABS: 50.0000 mg | ORAL_TABLET | Freq: Three times a day (TID) | ORAL | 3 refills | Status: DC

## 2022-01-08 ENCOUNTER — Other Ambulatory Visit: Payer: Self-pay | Admitting: Student

## 2022-01-08 DIAGNOSIS — I10 Essential (primary) hypertension: Secondary | ICD-10-CM

## 2022-02-04 ENCOUNTER — Ambulatory Visit: Payer: BC Managed Care – PPO | Admitting: Student

## 2022-02-09 ENCOUNTER — Encounter: Payer: Self-pay | Admitting: Student

## 2022-02-09 ENCOUNTER — Other Ambulatory Visit: Payer: Self-pay

## 2022-02-09 ENCOUNTER — Ambulatory Visit: Payer: BC Managed Care – PPO | Admitting: Student

## 2022-02-09 VITALS — BP 182/108 | HR 81 | Temp 97.8°F | Resp 17 | Ht 66.0 in | Wt 279.2 lb

## 2022-02-09 DIAGNOSIS — I5033 Acute on chronic diastolic (congestive) heart failure: Secondary | ICD-10-CM

## 2022-02-09 DIAGNOSIS — R9431 Abnormal electrocardiogram [ECG] [EKG]: Secondary | ICD-10-CM

## 2022-02-09 DIAGNOSIS — I10 Essential (primary) hypertension: Secondary | ICD-10-CM

## 2022-02-09 DIAGNOSIS — R0609 Other forms of dyspnea: Secondary | ICD-10-CM

## 2022-02-09 MED ORDER — SPIRONOLACTONE 50 MG PO TABS: ORAL_TABLET | ORAL | 3 refills | Status: DC

## 2022-02-09 MED ORDER — CARVEDILOL 12.5 MG PO TABS: ORAL_TABLET | ORAL | 3 refills | Status: DC

## 2022-02-09 NOTE — Progress Notes (Signed)
? ? ?Patient referred by Holland Commons, FNP for hypertension ? ?Subjective:  ? ?Jessica [name], female    DOB: June 01, 1971, 51 y.o.   MRN: [id number] ? ?Chief Complaint  ?Patient presents with  ? HFpEF  ? Hypertension  ?  4 WEEKS  ?  ? ?HPI ? ?51 y.o. African-American female with hypertension, HFpEF.  ? ?In the past patient has been resistant to guideline directed medical therapy for HFpEF as she is resistant to additional medication/medication changes. ? ?Patient presents for 4-week follow-up of HFpEF and hypertension.  At last office visit after extensive discussion patient was willing to try hydralazine 50 mg 3 times daily however after leaving the office she decided not to take this at all.  She is also been out of spironolactone for the last 2 days.  Patient is otherwise asymptomatic.  Continues to have bilateral leg edema which is stable.  Patient refuses additional medications other than carvedilol, spironolactone, and atorvastatin. ? ?Current Outpatient Medications on File Prior to Visit  ?Medication Sig Dispense Refill  ? albuterol (VENTOLIN HFA) 108 (90 Base) MCG/ACT inhaler SMARTSIG:1 Puff(s) By Mouth Every 4 Hours PRN    ? atorvastatin (LIPITOR) 40 MG tablet Take 40 mg by mouth daily.    ? Vitamin D, Ergocalciferol, (DRISDOL) 1.25 MG (50000 UT) CAPS capsule Take 1 capsule by mouth once a week.    ? ?No current facility-administered medications on file prior to visit.  ? ? ?Cardiovascular studies: ?EKG 01/07/2022:  ?Sinus rhythm 75 bpm ?Left atrial enlargement ?Left ventricular hypertrophy with likely secondary ST-T changes ?Lateral T wave inversion ?Consider lateral and anterior ischemia  ? ?Echocardiogram 07/22/2021:  ?Left ventricle cavity is normal in size. Moderate concentric hypertrophy  ?of the left ventricle. Normal global wall motion. Normal LV systolic  ?function with EF 57%. Doppler evidence of grade III (restrictive)  ?diastolic dysfunction, elevated LAP.  ?Left atrial cavity is mildly  dilated.  ?No significant valvular abnormality.  ?Compared to previous study on 6/73/4193, diastolic dysfunction has progressed from grade II to grade III. ? ?EKG 07/16/2021: ?Sinus rhythm 75 bpm ?Left atrial enlargement ?Left ventricular hypertrophy ?Lead loss V5 ?Lateral T wave inversion, consider ischemia  ? ?CTA chest 07/12/2021: ?1. Technically adequate exam showing no acute pulmonary embolus. ?2. Mild cardiomegaly.  No edema or acute pulmonary abnormality. ? ?Renal artery duplex  09/04/2019: ?No evidence of renal artery occlusive disease in either renal artery. Normal velocity in bilateral renal arteries.  ?Normal intrarenal vascular perfusion is noted in both kidneys. Normal kidney size bilaterally.  ?Normal study.  ? ?Echocardiogram 09/04/2019:  ?Normal LV systolic function with EF 55%. Left ventricle cavity is normal in size. Moderate concentric hypertrophy of the left ventricle. Normal global wall motion. Doppler evidence of grade II (pseudonormal) diastolic dysfunction. Diastolic dysfunction do not suggest elevated LA/LV endiastolic pressure. ?Left atrial cavity is mildly dilated at 4.1 cm. ?IVC is dilated with a respiratory response of <50%. This may suggest elevated right heart pressure ? ?EKG 07/17/2019:  ?Sinus rhythm 84 bpm.  Inferolateral T wave inversions, consider ischemia. ? ?Recent labs: ?CMP Latest Ref Rng & Units 07/20/2021 07/12/2021 04/23/2021  ?Glucose 65 - 99 mg/dL 98 108(H) 104(H)  ?BUN 6 - 24 mg/dL '10 8 13  ' ?Creatinine 0.57 - 1.00 mg/dL 0.92 0.80 0.85  ?Sodium 134 - 144 mmol/L 136 135 133(L)  ?Potassium 3.5 - 5.2 mmol/L 4.2 3.7 3.7  ?Chloride 96 - 106 mmol/L 101 100 97(L)  ?CO2 20 - 29 mmol/L 19(L) 25  26  ?Calcium 8.7 - 10.2 mg/dL 9.9 9.2 9.4  ?Total Protein 6.5 - 8.1 g/dL - - 8.2(H)  ?Total Bilirubin 0.3 - 1.2 mg/dL - - 1.5(H)  ?Alkaline Phos 38 - 126 U/L - - 72  ?AST 15 - 41 U/L - - 24  ?ALT 0 - 44 U/L - - 23  ? ?CBC Latest Ref Rng & Units 07/12/2021 04/23/2021  ?WBC 4.0 - 10.5 K/uL 10.5 12.0(H)   ?Hemoglobin 12.0 - 15.0 g/dL 13.6 13.2  ?Hematocrit 36.0 - 46.0 % 40.1 39.7  ?Platelets 150 - 400 K/uL 312 320  ? ?Lipid Panel  ?No results found for: CHOL, TRIG, HDL, CHOLHDL, VLDL, LDLCALC, LDLDIRECT ?HEMOGLOBIN A1C ?No results found for: HGBA1C, MPG ?TSH ?No results for input(s): TSH in the last 8760 hours. ? ?Other Labs:  ?07/20/2021: ?Glucose 98, BUN/Cr 10/0.92. EGFR 77. Na/K 136/4.2.  ?BNP 254 ? ?07/12/2021: ?Glucose 108, BUN/Cr 8/0.80. EGFR >60. Na/K 135/3.7.  ?H/H 13/40. MCV 87. Platelets 312 ? ?Results for CHARISMA, CHARLOT (MRN [id number]) as of 07/15/2021 16:43 ? Ref. Range 07/12/2021 15:08 07/12/2021 15:45 07/12/2021 16:59  ?B Natriuretic Peptide Latest Ref Range: 0.0 - 100.0 pg/mL 161.9 (H)    ?Troponin I (High Sensitivity) Latest Ref Range: <18 ng/L 20 (H)  20 (H)  ? ? ?06/28/2019: ?Glucose 90. BUN/Cr 20/0.8., eGFR 74. Na/K 141/4.4 ?H/H 15/48. MCV 91. Platelets 228.  ?Chol 158, TG 186, HDL 50, LDL 71 ?HbA1C 5.5% ? ? ?Review of Systems  ?Constitutional: Positive for weight gain. Negative for malaise/fatigue.  ?Cardiovascular:  Positive for dyspnea on exertion (stable) and leg swelling (stable). Negative for chest pain, claudication, near-syncope, orthopnea, palpitations, paroxysmal nocturnal dyspnea and syncope.  ?Respiratory:  Negative for shortness of breath.   ?Neurological:  Negative for dizziness.  ? ?   ? ? ?Vitals:  ? 02/09/22 1441 02/09/22 1455  ?BP: (!) 176/125 (!) 182/108  ?Pulse: 78 81  ?Resp: 17   ?Temp: 97.8 ?F (36.6 ?C)   ?SpO2: 99% 99%  ? ?Body mass index is 45.06 kg/m?. [name] Weights  ? 02/09/22 1441  ?Weight: 279 lb 3.2 oz (126.6 kg)  ? ? ? ?Objective:  ? Physical Exam ?Vitals reviewed.  ?Constitutional:   ?   General: She is not in acute distress. ?   Appearance: She is obese.  ?Neck:  ?   Vascular: No JVD.  ?Cardiovascular:  ?   Rate and Rhythm: Normal rate and regular rhythm.  ?   Heart sounds: Normal heart sounds. No murmur heard. ?Pulmonary:  ?   Effort: Pulmonary effort is normal.  ?    Breath sounds: Normal breath sounds.  ?Musculoskeletal:  ?   Right lower leg: Edema (trace) present.  ?   Left lower leg: Edema (trace) present.  ?Skin: ?   General: Skin is warm and dry.  ? ?   ?Assessment & Recommendations:  ? ?51 y.o. African-American female with hypertension, HFpEF ? ?HFpEF: ?Reiterated to patient the benefits of guideline directed medical therapy including SGLT2 inhibitors, Entresto, and BiDil.  ?Patient acknowledges the benefits of uptitration of guideline directed therapy and verbalized understanding of the risks of leaving blood pressure uncontrolled and not aggressively uptitrating guideline directed medical therapy for heart failure, including recurrent hospitalizations and acute decompensated heart failure.  ?Despite recognition of these benefits patient remains resistant to up titration of therapy, he is only willing to take carvedilol and spironolactone at this time. ? ?Suspect patient's dyspnea on exertion is related to underlying HFpEF which is  likely due to longstanding hypertension. However given EKG with concerns for lateral and anterior ischemia as well as dyspnea and multiple cardiovascular risk factors shared decision was to proceed with further ischemic evaluation.  We will therefore obtain coronary CTA.  Patient is advised to take carvedilol 25 mg instead of 12.5 mg for 2 days prior to her CTA as well as the morning of the imaging. ? ?Hypertension: ?Blood pressure remains uncontrolled.  Despite extensive discussion regarding importance of blood pressure management and the risks of uncontrolled hypertension patient remains resistant to additional antihypertensive medications.  She will continue carvedilol and spironolactone and continue to focus on diet and lifestyle modifications. ? ?Follow-up in 6 weeks, sooner if needed. ? ? ?Alethia Berthold, PA-C ?02/09/2022, 4:13 PM ?Office: [phone] ?

## 2022-03-12 ENCOUNTER — Telehealth (HOSPITAL_COMMUNITY): Payer: Self-pay | Admitting: *Deleted

## 2022-03-12 NOTE — Telephone Encounter (Signed)
Attempted to call patient regarding upcoming cardiac CT appointment. °Left message on voicemail with name and callback number ° °Selby Slovacek RN Navigator Cardiac Imaging °[facility] °[phone] Office °[phone] Cell ° °

## 2022-03-15 ENCOUNTER — Ambulatory Visit (HOSPITAL_COMMUNITY)
Admission: RE | Admit: 2022-03-15 | Discharge: 2022-03-15 | Disposition: A | Discharge status: Home / Self Care | Payer: BC Managed Care – PPO | Source: Ambulatory Visit | Attending: Student | Admitting: Student

## 2022-03-15 DIAGNOSIS — I2510 Atherosclerotic heart disease of native coronary artery without angina pectoris: Secondary | ICD-10-CM | POA: Insufficient documentation

## 2022-03-15 DIAGNOSIS — R9431 Abnormal electrocardiogram [ECG] [EKG]: Secondary | ICD-10-CM | POA: Insufficient documentation

## 2022-03-15 DIAGNOSIS — I5033 Acute on chronic diastolic (congestive) heart failure: Secondary | ICD-10-CM | POA: Insufficient documentation

## 2022-03-15 DIAGNOSIS — R0609 Other forms of dyspnea: Secondary | ICD-10-CM | POA: Diagnosis present

## 2022-03-15 DIAGNOSIS — I700 Atherosclerosis of aorta: Secondary | ICD-10-CM | POA: Diagnosis not present

## 2022-03-15 DIAGNOSIS — I517 Cardiomegaly: Secondary | ICD-10-CM | POA: Diagnosis not present

## 2022-03-15 MED ORDER — IOHEXOL 350 MG/ML SOLN
100.0000 mL | Freq: Once | INTRAVENOUS | Status: AC | PRN
  Administered 2022-03-15: 100 mL via INTRAVENOUS

## 2022-03-15 MED ORDER — METOPROLOL TARTRATE 5 MG/5ML IV SOLN
5.0000 mg | INTRAVENOUS | Status: DC | PRN
  Administered 2022-03-15: 10 mg via INTRAVENOUS

## 2022-03-15 MED ORDER — NITROGLYCERIN 0.4 MG SL SUBL
SUBLINGUAL_TABLET | SUBLINGUAL | Status: AC
  Filled 2022-03-15: qty 2

## 2022-03-15 MED ORDER — METOPROLOL TARTRATE 5 MG/5ML IV SOLN
INTRAVENOUS | Status: AC
  Filled 2022-03-15: qty 10

## 2022-03-15 MED ORDER — NITROGLYCERIN 0.4 MG SL SUBL
0.8000 mg | SUBLINGUAL_TABLET | Freq: Once | SUBLINGUAL | Status: AC
Start: 2022-03-15 — End: 2022-03-15
  Administered 2022-03-15: 0.8 mg via SUBLINGUAL

## 2022-03-18 ENCOUNTER — Ambulatory Visit: Payer: BC Managed Care – PPO | Admitting: Student

## 2022-03-18 ENCOUNTER — Encounter: Payer: Self-pay | Admitting: Student

## 2022-03-18 VITALS — BP 157/92 | HR 73 | Temp 97.5°F | Resp 16 | Ht 66.0 in | Wt 282.0 lb

## 2022-03-18 DIAGNOSIS — I5032 Chronic diastolic (congestive) heart failure: Secondary | ICD-10-CM

## 2022-03-18 DIAGNOSIS — I10 Essential (primary) hypertension: Secondary | ICD-10-CM

## 2022-03-18 DIAGNOSIS — R931 Abnormal findings on diagnostic imaging of heart and coronary circulation: Secondary | ICD-10-CM

## 2022-03-18 MED ORDER — EZETIMIBE 10 MG PO TABS: 10.0000 mg | ORAL_TABLET | Freq: Every day | ORAL | 3 refills | Status: DC

## 2022-03-18 NOTE — Progress Notes (Signed)
? ? ?Patient referred by Holland Commons, FNP for hypertension ? ?Subjective:  ? ?Jessica [name], female    DOB: 01-11-1971, 51 y.o.   MRN: [id number] ? ?Chief Complaint  ?Patient presents with  ? Hypertension  ? Chronic heart failure with preserved ejection fraction   ? Follow-up  ?  ? ?HPI ? ?51 y.o. African-American female with hypertension, HFpEF.  ? ?In the past patient has been resistant to guideline directed medical therapy for HFpEF as she is resistant to additional medications/medication changes. ? ?Patient presents for 6-week follow-up.  At last office visit given EKG changes concerning for ischemia ordered coronary CTA which revealed mild nonobstructive CAD, total coronary calcium score of 8.13, placing patient in the 91st percentile for age and sex.  Patient is asymptomatic at this time. ? ?Current Outpatient Medications on File Prior to Visit  ?Medication Sig Dispense Refill  ? albuterol (VENTOLIN HFA) 108 (90 Base) MCG/ACT inhaler SMARTSIG:1 Puff(s) By Mouth Every 4 Hours PRN    ? atorvastatin (LIPITOR) 40 MG tablet Take 40 mg by mouth daily.    ? carvedilol (COREG) 12.5 MG tablet TAKE 1 TABLET(12.5 MG) BY MOUTH TWICE DAILY WITH A MEAL 180 tablet 3  ? spironolactone (ALDACTONE) 50 MG tablet TAKE 1 TABLET(50 MG) BY MOUTH DAILY 90 tablet 3  ? Vitamin D, Ergocalciferol, (DRISDOL) 1.25 MG (50000 UT) CAPS capsule Take 1 capsule by mouth once a week.    ? ?No current facility-administered medications on file prior to visit.  ? ? ?Cardiovascular studies: ?Coronary CTA 03/15/2022: ?1. Total coronary calcium score of 8.13. This was 91st percentile for age and sex matched control. ? 2. Normal coronary origin with left dominance. ? 3. Artifact: Moderate. (signal-to-noise (BMI 45)/ motion). ? 4. CAD-RADS = 2 Mild non-obstructive CAD. ? Left main: Patent. ? LAD: Patent. ? LCX: Mild stenosis (25-49%) closer to 25% at mid LCx due to non calcified plaque. ? RCA: Mild stenosis (25-49%) at proximal RCA due to  eccentric noncalcified plaque. ? 5. Aortic atherosclerosis.  ?6. Main pulmonary artery mildly dilated without proximal filling defect. ?  ?RECOMMENDATIONS: Consider non-atherosclerotic causes of chest pain. Consider ?preventive therapy and risk factor modification. ? ?EKG 01/07/2022:  ?Sinus rhythm 75 bpm ?Left atrial enlargement ?Left ventricular hypertrophy with likely secondary ST-T changes ?Lateral T wave inversion ?Consider lateral and anterior ischemia  ? ?Echocardiogram 07/22/2021:  ?Left ventricle cavity is normal in size. Moderate concentric hypertrophy  ?of the left ventricle. Normal global wall motion. Normal LV systolic  ?function with EF 57%. Doppler evidence of grade III (restrictive)  ?diastolic dysfunction, elevated LAP.  ?Left atrial cavity is mildly dilated.  ?No significant valvular abnormality.  ?Compared to previous study on 3/76/2831, diastolic dysfunction has progressed from grade II to grade III. ? ?EKG 07/16/2021: ?Sinus rhythm 75 bpm ?Left atrial enlargement ?Left ventricular hypertrophy ?Lead loss V5 ?Lateral T wave inversion, consider ischemia  ? ?CTA chest 07/12/2021: ?1. Technically adequate exam showing no acute pulmonary embolus. ?2. Mild cardiomegaly.  No edema or acute pulmonary abnormality. ? ?Renal artery duplex  09/04/2019: ?No evidence of renal artery occlusive disease in either renal artery. Normal velocity in bilateral renal arteries.  ?Normal intrarenal vascular perfusion is noted in both kidneys. Normal kidney size bilaterally.  ?Normal study.  ? ?Echocardiogram 09/04/2019:  ?Normal LV systolic function with EF 55%. Left ventricle cavity is normal in size. Moderate concentric hypertrophy of the left ventricle. Normal global wall motion. Doppler evidence of grade II (pseudonormal) diastolic dysfunction. Diastolic  dysfunction do not suggest elevated LA/LV endiastolic pressure. ?Left atrial cavity is mildly dilated at 4.1 cm. ?IVC is dilated with a respiratory response of <50%. This  may suggest elevated right heart pressure ? ?EKG 07/17/2019:  ?Sinus rhythm 84 bpm.  Inferolateral T wave inversions, consider ischemia. ? ?Recent labs: ? ?  Latest Ref Rng & Units 07/20/2021  ?  2:14 PM 07/12/2021  ?  3:08 PM 04/23/2021  ?  5:29 PM  ?CMP  ?Glucose 65 - 99 mg/dL 98   108   104    ?BUN 6 - 24 mg/dL '10   8   13    ' ?Creatinine 0.57 - 1.00 mg/dL 0.92   0.80   0.85    ?Sodium 134 - 144 mmol/L 136   135   133    ?Potassium 3.5 - 5.2 mmol/L 4.2   3.7   3.7    ?Chloride 96 - 106 mmol/L 101   100   97    ?CO2 20 - 29 mmol/L '19   25   26    ' ?Calcium 8.7 - 10.2 mg/dL 9.9   9.2   9.4    ?Total Protein 6.5 - 8.1 g/dL   8.2    ?Total Bilirubin 0.3 - 1.2 mg/dL   1.5    ?Alkaline Phos 38 - 126 U/L   72    ?AST 15 - 41 U/L   24    ?ALT 0 - 44 U/L   23    ? ? ?  Latest Ref Rng & Units 07/12/2021  ?  3:08 PM 04/23/2021  ?  5:29 PM  ?CBC  ?WBC 4.0 - 10.5 K/uL 10.5   12.0    ?Hemoglobin 12.0 - 15.0 g/dL 13.6   13.2    ?Hematocrit 36.0 - 46.0 % 40.1   39.7    ?Platelets 150 - 400 K/uL 312   320    ? ?Lipid Panel  ?No results found for: CHOL, TRIG, HDL, CHOLHDL, VLDL, LDLCALC, LDLDIRECT ?HEMOGLOBIN A1C ?No results found for: HGBA1C, MPG ?TSH ?No results for input(s): TSH in the last 8760 hours. ? ?Other Labs:  ?02/18/2022: ?BUN 11, creatinine 0.93, potassium 4.2, sodium 138, GFR >60 ?Total cholesterol 150, HDL 42, LDL 92, triglycerides 79 ?Hgb 13.7, HCT 40.6, MCV 87.5, platelet 299 ? ?07/20/2021: ?Glucose 98, BUN/Cr 10/0.92. EGFR 77. Na/K 136/4.2.  ?BNP 254 ? ?07/12/2021: ?Glucose 108, BUN/Cr 8/0.80. EGFR >60. Na/K 135/3.7.  ?H/H 13/40. MCV 87. Platelets 312 ? ?Results for [place], WELCOME (MRN [id number]) as of 07/15/2021 16:43 ? Ref. Range 07/12/2021 15:08 07/12/2021 15:45 07/12/2021 16:59  ?B Natriuretic Peptide Latest Ref Range: 0.0 - 100.0 pg/mL 161.9 (H)    ?Troponin I (High Sensitivity) Latest Ref Range: <18 ng/L 20 (H)  20 (H)  ? ? ?06/28/2019: ?Glucose 90. BUN/Cr 20/0.8., eGFR 74. Na/K 141/4.4 ?H/H 15/48. MCV 91. Platelets 228.   ?Chol 158, TG 186, HDL 50, LDL 71 ?HbA1C 5.5% ? ? ?Review of Systems  ?Constitutional: Positive for weight gain. Negative for malaise/fatigue.  ?Cardiovascular:  Positive for dyspnea on exertion (stable) and leg swelling (stable). Negative for chest pain, claudication, near-syncope, orthopnea, palpitations, paroxysmal nocturnal dyspnea and syncope.  ?Respiratory:  Negative for shortness of breath.   ?Neurological:  Negative for dizziness.  ? ?   ? ? ?Vitals:  ? 03/18/22 1127 03/18/22 1128  ?BP: (!) 151/96 (!) 157/92  ?Pulse: 72 73  ?Resp: 16   ?Temp: (!) 97.5 ?F (36.4 ?C)   ?  SpO2: 98%   ? ?Body mass index is 45.52 kg/m?. [name] Weights  ? 03/18/22 1127  ?Weight: 282 lb (127.9 kg)  ? ? ? ?Objective:  ? Physical Exam ?Vitals reviewed.  ?Constitutional:   ?   General: She is not in acute distress. ?   Appearance: She is obese.  ?Neck:  ?   Vascular: No JVD.  ?Cardiovascular:  ?   Rate and Rhythm: Normal rate and regular rhythm.  ?   Heart sounds: Normal heart sounds. No murmur heard. ?Pulmonary:  ?   Effort: Pulmonary effort is normal.  ?   Breath sounds: Normal breath sounds.  ?Musculoskeletal:  ?   Right lower leg: Edema (trace) present.  ?   Left lower leg: Edema (trace) present.  ?Skin: ?   General: Skin is warm and dry.  ?Physical exam unchanged compared to previous office visit. ?   ?Assessment & Recommendations:  ? ?51 y.o. African-American female with hypertension, HFpEF ? ?HFpEF: ?I have multiple times reiterated to patient the benefits of guideline directed medical therapy including SGLT2 inhibitors, Entresto, and BiDil.  ?Patient acknowledges the benefits of uptitration of guideline directed therapy and verbalized understanding of the risks of leaving blood pressure uncontrolled and not aggressively uptitrating guideline directed medical therapy for heart failure, including recurrent hospitalizations and acute decompensated heart failure.  ?Despite recognition of these benefits patient remains resistant to  up titration of therapy, he is only willing to take carvedilol and spironolactone at this time. ? ?Reviewed and discussed with patient results of coronary CTA, details above.  CTA revealed nonobstructive CAD with

## 2022-03-23 ENCOUNTER — Ambulatory Visit: Payer: BC Managed Care – PPO | Admitting: Student

## 2022-05-19 ENCOUNTER — Ambulatory Visit: Payer: BC Managed Care – PPO | Admitting: Podiatry

## 2022-05-19 ENCOUNTER — Ambulatory Visit (INDEPENDENT_AMBULATORY_CARE_PROVIDER_SITE_OTHER): Payer: BC Managed Care – PPO

## 2022-05-19 ENCOUNTER — Encounter: Payer: Self-pay | Admitting: Podiatry

## 2022-05-19 DIAGNOSIS — M2042 Other hammer toe(s) (acquired), left foot: Secondary | ICD-10-CM

## 2022-05-19 NOTE — Progress Notes (Signed)
Subjective:   Patient ID: Jessica [name], female   DOB: 51 y.o.   MRN: [id number]   HPI Patient states she is getting pain in the big toe joint left and she is getting irritation from the pins also from the surgery.  States she is very happy with how the surgery is done but this is becoming more bothersome for her   ROS      Objective:  Physical Exam  Neurovascular status intact good circulatory flow is noted good digital perfusion with patient found to have discomfort in the first MPJ left with spur formation and pin x2 which are irritated dorsal left foot     Assessment:  Inflammatory condition with spur formation and abnormal pin position x2 with spur that is only developed over the last year     Plan:  H&P condition reviewed and I went ahead today and I discussed treatment options including the consideration of injecting the joint versus removing the spur and not removing the 2 pins.  She would just rather go ahead and have it fixed due to that are bothering and I do think this should take care of this long-term.  I went ahead today explained the procedure allowed her to read consent form went over all possible complications as outlined and then patient signed consent form.  Scheduled for outpatient surgery which should take about 1 hour to do and I did discuss no guarantee this will solve all problems but most likely should resolve any discomfort that she still has  X-rays indicate a small spur which is formed in the first metatarsal left which may be reactive bone formation that was not there at last visit last year with excellent structural correction of the deformity and pins in place x2 which appear irritated

## 2022-05-21 ENCOUNTER — Telehealth: Payer: Self-pay | Admitting: Urology

## 2022-05-21 NOTE — Telephone Encounter (Signed)
DOS - 05/25/22  CHEILECTOMY LEFT --- 02637 REMOVAL FIXATION DEEP X'S 2 LEFT --- 20680   [employer] EFFECTIVE DATE - 12/06/21  PLAN DEDUCTIBLE - $1,250.00 W/ $0.00 REMAINING OUT OF POCKET - $4,890.00 W/ $3,194.65 REMAINING COINSURANCE - 20% COPAY - $0.00  NO PRIOR AUTH REQUIRED

## 2022-05-24 MED ORDER — HYDROCODONE-ACETAMINOPHEN 10-325 MG PO TABS: 1.0000 | ORAL_TABLET | Freq: Three times a day (TID) | ORAL | 0 refills | Status: AC | PRN

## 2022-05-24 NOTE — Addendum Note (Signed)
Addended by: Lenn Sink on: 05/24/2022 11:58 AM   Modules accepted: Orders

## 2022-05-25 ENCOUNTER — Encounter: Payer: Self-pay | Admitting: Podiatry

## 2022-05-25 DIAGNOSIS — M2022 Hallux rigidus, left foot: Secondary | ICD-10-CM

## 2022-05-25 DIAGNOSIS — Z4889 Encounter for other specified surgical aftercare: Secondary | ICD-10-CM

## 2022-05-31 IMAGING — CT CT ANGIO CHEST
3 of 11 series · 17 of 36 positions shown · IV contrast (omnipaque)
Comparison: Chest x-ray on 07/12/2021

CLINICAL DATA: Suspected pulmonary embolus.  Shortness of breath.

EXAM:
CT ANGIOGRAPHY CHEST WITH CONTRAST
TECHNIQUE: Multidetector CT imaging of the chest was performed using the
standard protocol during bolus administration of intravenous
contrast. Multiplanar CT image reconstructions and MIPs were
obtained to evaluate the vascular anatomy.
CONTRAST:  100mL OMNIPAQUE IOHEXOL 350 MG/ML SOLN

[Series 7: pe thins · axial · 0.78mm/px · z∈[+868,+1087]mm · 14 of 253 slices shown]
[im 17/253  lung]
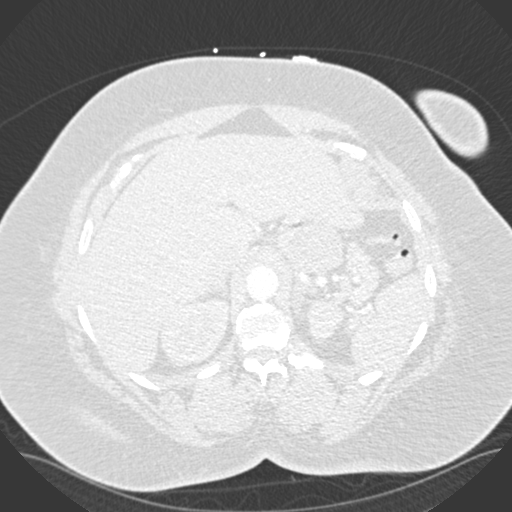
[im 34/253  mediastinal]
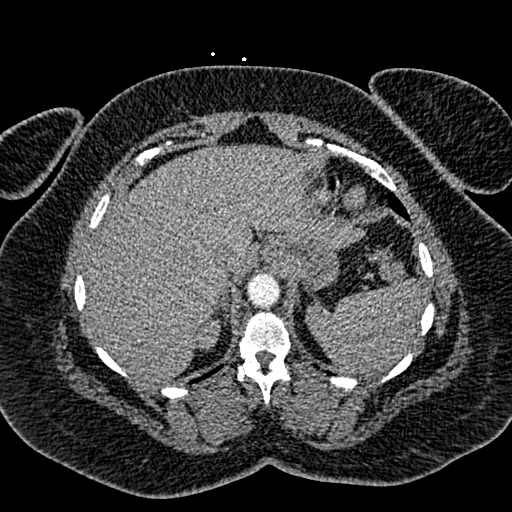
[im 51/253  lung]
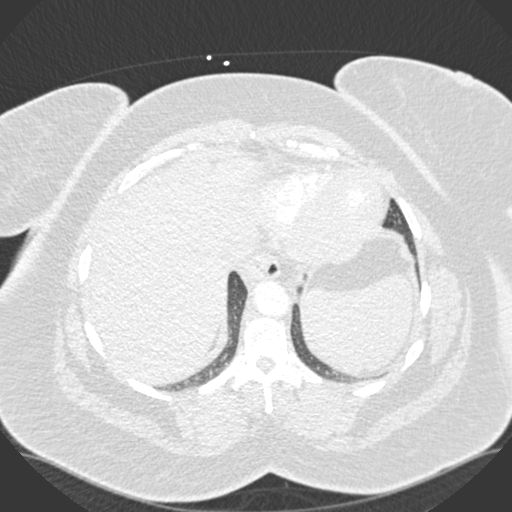
[im 68/253  mediastinal]
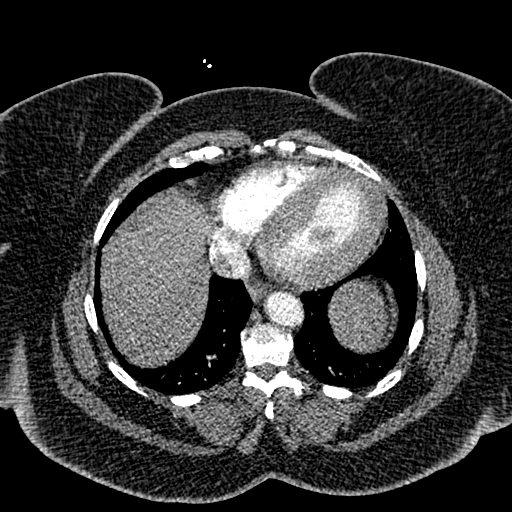
[im 85/253  lung]
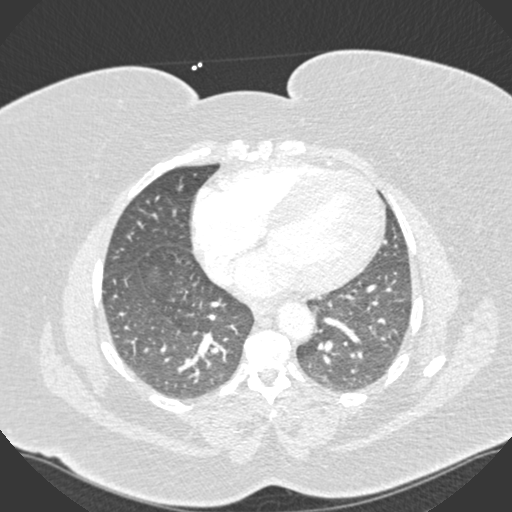
[im 101/253  mediastinal]
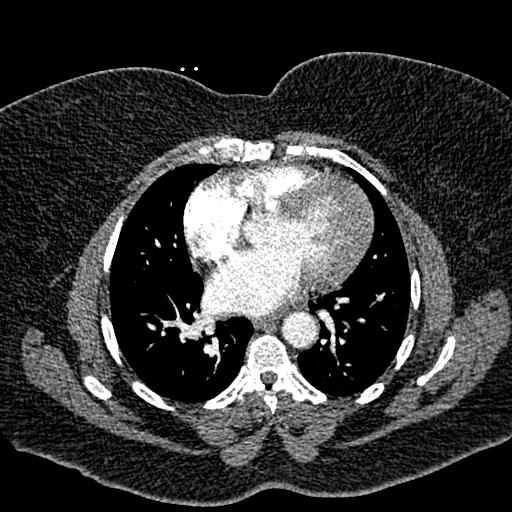
[im 118/253  lung]
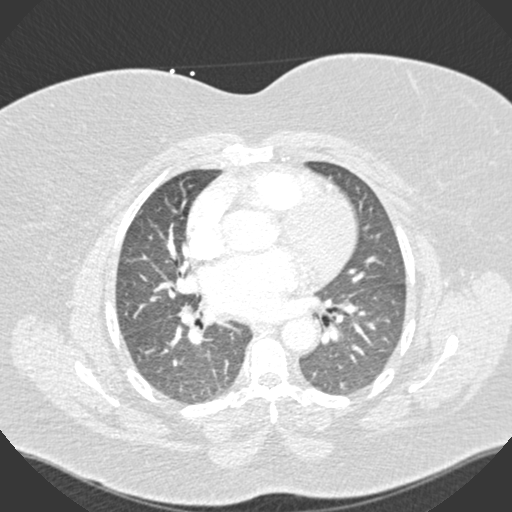
[im 135/253  mediastinal]
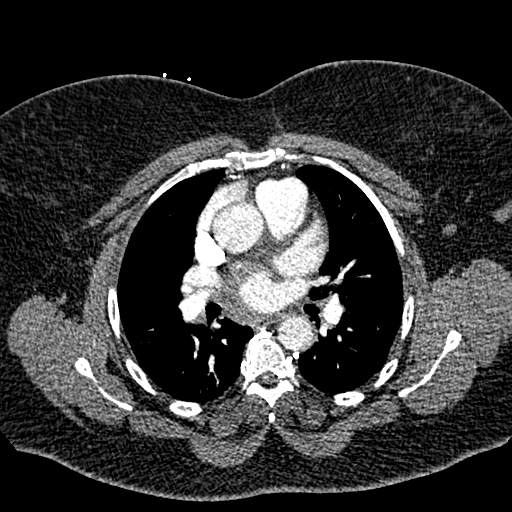
[im 152/253  lung]
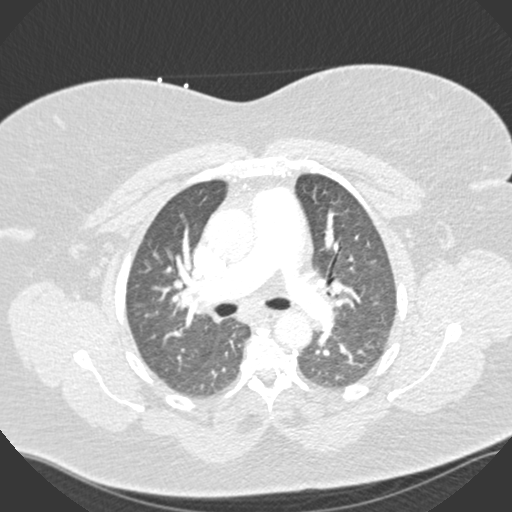
[im 169/253  mediastinal]
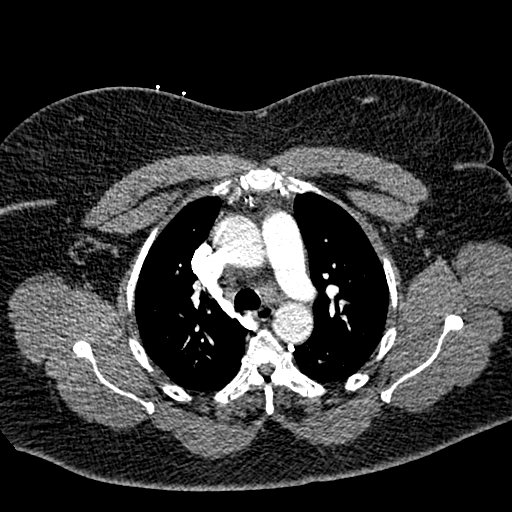
[im 185/253  lung]
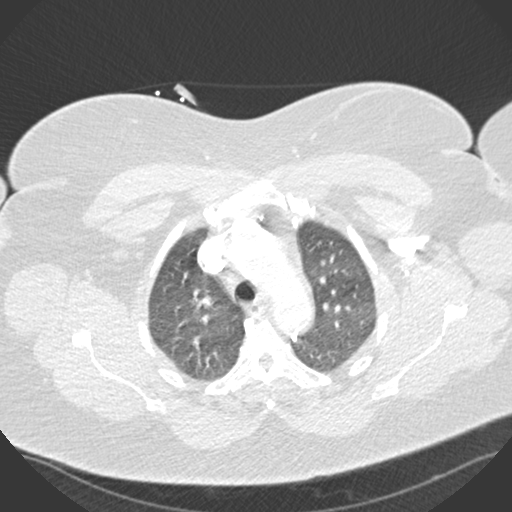
[im 202/253  mediastinal]
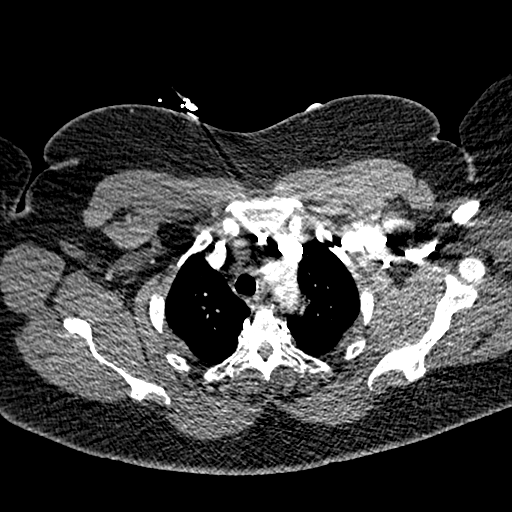
[im 219/253  lung]
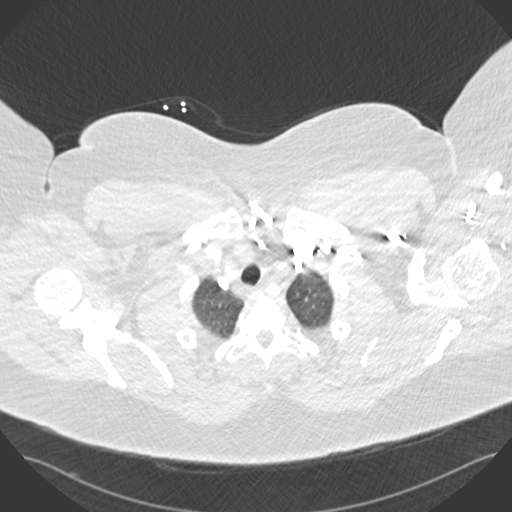
[im 236/253  mediastinal]
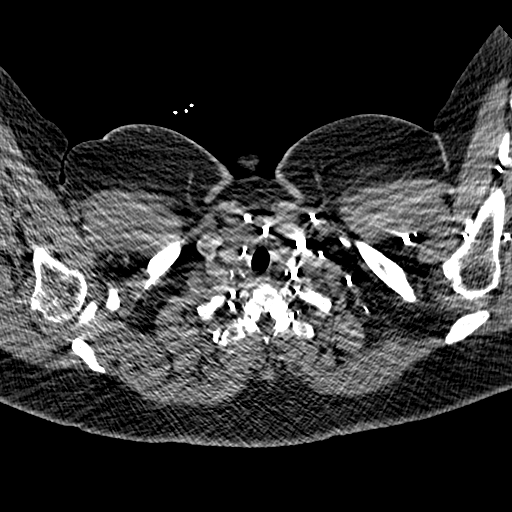

[Series 8: pe lung · axial · 0.98mm/px · z∈[+960,+1032]mm · 2 of 72 slices shown]
[im 24/72  mediastinal]
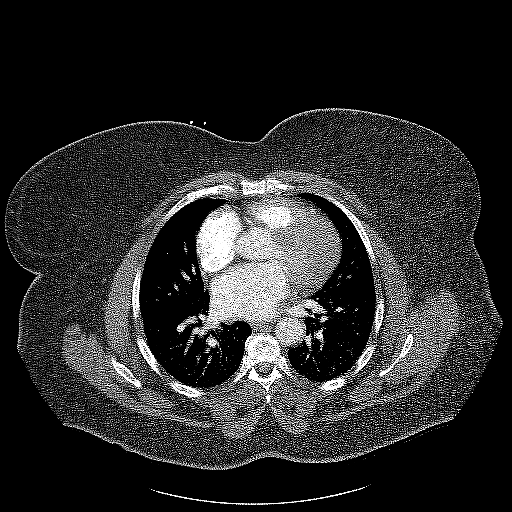
[im 48/72  mediastinal]
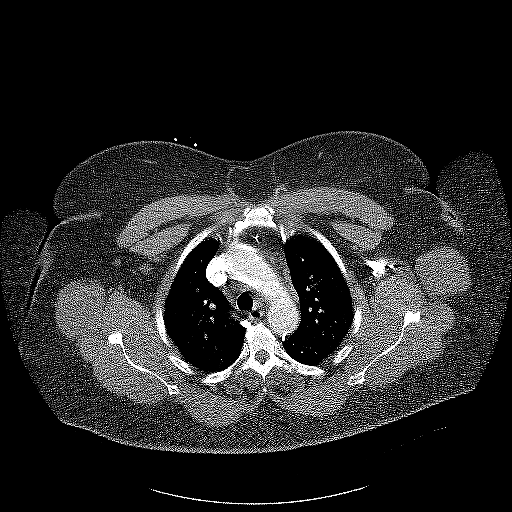

[Series 9: pe coronal mpr · coronal · 0.52mm/px · 1 of 158 slices shown]
[im 79/158  mediastinal]
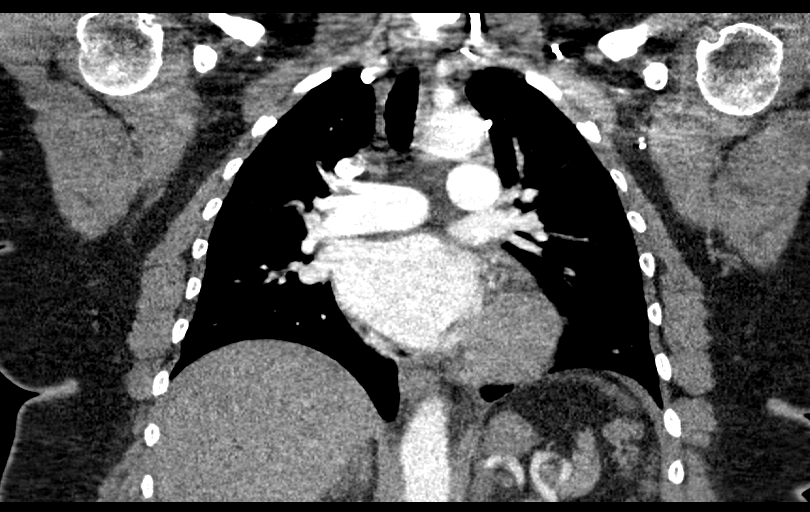

[17 of 36 positions shown; findings below may reference images not displayed]

FINDINGS: Cardiovascular: Heart is mildly enla4rged. Trace pericardial
effusion. No significant coronary artery calcifications. The
thoracic aorta is not aneurysmal. Probable bovine arch anatomy.
Significant artifact in the UPPER chest.

Pulmonary arteries are well opacified by contrast bolus. There is no
acute pulmonary embolus.

Mediastinum/Nodes: The visualized portion of the thyroid gland has a
normal appearance. No significant mediastinal, hilar, or axillary
adenopathy. Esophagus is unremarkable.

Lungs/Pleura: No pleural effusions or consolidations. No pulmonary
edema. Airways are patent.

Upper Abdomen: No acute abnormality.

Musculoskeletal: Moderate degenerative changes in the thoracic
spine. No acute osseous abnormality.

Review of the MIP images confirms the above findings.
IMPRESSION: 1. Technically adequate exam showing no acute pulmonary embolus.
2. Mild cardiomegaly.  No edema or acute pulmonary abnormality.

## 2022-06-02 ENCOUNTER — Encounter: Payer: Self-pay | Admitting: Podiatry

## 2022-06-02 ENCOUNTER — Ambulatory Visit (INDEPENDENT_AMBULATORY_CARE_PROVIDER_SITE_OTHER): Payer: BC Managed Care – PPO

## 2022-06-02 ENCOUNTER — Ambulatory Visit (INDEPENDENT_AMBULATORY_CARE_PROVIDER_SITE_OTHER): Payer: BC Managed Care – PPO | Admitting: Podiatry

## 2022-06-02 DIAGNOSIS — Z9889 Other specified postprocedural states: Secondary | ICD-10-CM | POA: Diagnosis not present

## 2022-06-02 NOTE — Progress Notes (Signed)
  Subjective:  Patient ID: Jessica [name], female    DOB: November 22, 1971,  MRN: [id number]  Chief Complaint  Patient presents with   Routine Post Op    POV #1 DOS 05/25/2022 CHEILECTOMY LT, REMOVAL PINS X 2/DR REGAL PT. Pt states she has been doing well. Feels a lot better since pins were surgically removed.     DOS: 05/25/22 Procedure: Left cheilectomy with pin removal  51 y.o. female returns for POV#1. Patient relates doing well and feeling a lot better since the pins were removed. No concerns.   Review of Systems: Negative except as noted in the HPI. Denies N/V/F/Ch.  Past Medical History:  Diagnosis Date   Hyperlipidemia    Hypertension     Current Outpatient Medications:    albuterol (VENTOLIN HFA) 108 (90 Base) MCG/ACT inhaler, SMARTSIG:1 Puff(s) By Mouth Every 4 Hours PRN, Disp: , Rfl:    atorvastatin (LIPITOR) 40 MG tablet, Take 40 mg by mouth daily., Disp: , Rfl:    carvedilol (COREG) 12.5 MG tablet, TAKE 1 TABLET(12.5 MG) BY MOUTH TWICE DAILY WITH A MEAL, Disp: 180 tablet, Rfl: 3   ezetimibe (ZETIA) 10 MG tablet, Take 1 tablet (10 mg total) by mouth daily., Disp: 30 tablet, Rfl: 3   spironolactone (ALDACTONE) 50 MG tablet, TAKE 1 TABLET(50 MG) BY MOUTH DAILY, Disp: 90 tablet, Rfl: 3   Vitamin D, Ergocalciferol, (DRISDOL) 1.25 MG (50000 UT) CAPS capsule, Take 1 capsule by mouth once a week., Disp: , Rfl:   Social History   Tobacco Use  Smoking Status Never  Smokeless Tobacco Never    Allergies  Allergen Reactions   Amlodipine Besylate Swelling    Other reaction(s): edema   Aspirin Nausea And Vomiting   Penicillins Hives   Triamterene-Hctz Nausea Only    Other reaction(s): nausea   Objective:  There were no vitals filed for this visit. There is no height or weight on file to calculate BMI. Constitutional Well developed. Well nourished.  Vascular Foot warm and well perfused. Capillary refill normal to all digits.   Neurologic Normal speech. Oriented to person,  place, and time. Epicritic sensation to light touch grossly present bilaterally.  Dermatologic Skin healing well without signs of infection. Skin edges well coapted without signs of infection.  Orthopedic: Tenderness to palpation noted about the surgical site.   Radiographs: Interval removal of pins. Toe well aligned.  Assessment:   1. Post-operative state    Plan:  Patient was evaluated and treated and all questions answered.  S/p foot surgery left -Progressing as expected post-operatively. -WB Status: WABT in surgical shoe -Sutures: intact. -Medications: n/a -Foot redressed. Follow-up in 2 weeks with Dr. Charlsie Merles.   Return in about 2 weeks (around 06/16/2022) for post op.

## 2022-06-16 ENCOUNTER — Ambulatory Visit (INDEPENDENT_AMBULATORY_CARE_PROVIDER_SITE_OTHER): Payer: BC Managed Care – PPO

## 2022-06-16 ENCOUNTER — Encounter: Payer: Self-pay | Admitting: Podiatry

## 2022-06-16 ENCOUNTER — Ambulatory Visit (INDEPENDENT_AMBULATORY_CARE_PROVIDER_SITE_OTHER): Payer: BC Managed Care – PPO | Admitting: Podiatry

## 2022-06-16 DIAGNOSIS — Z9889 Other specified postprocedural states: Secondary | ICD-10-CM | POA: Diagnosis not present

## 2022-06-16 NOTE — Progress Notes (Signed)
Subjective:   Patient ID: Jessica [name], female   DOB: 51 y.o.   MRN: [id number]   HPI Patient states doing excellent with surgery and feeling much better   ROS      Objective:  Physical Exam  Neurovascular status intact negative Denna Haggard' sign noted wound edges left coapted well with good incision site and good range of motion first MPJ no current crepitus noted     Assessment:  Doing well post removal of bone spur left first metatarsal  along with removal of 2 pins left     Plan:  H&P x-rays reviewed and went ahead and patient may return to normal shoe gear currently and encouraged her to increase activity levels.  Patient to be seen back on an as-needed basis very pleased so far with how she is doing  X-rays indicate satisfactory removal of bone spur satisfactory removal of 2 pins excellent alignment of the joint and it is congruent [place]

## 2022-09-16 ENCOUNTER — Ambulatory Visit: Payer: BC Managed Care – PPO | Admitting: Student

## 2022-10-15 ENCOUNTER — Encounter (HOSPITAL_BASED_OUTPATIENT_CLINIC_OR_DEPARTMENT_OTHER): Payer: Self-pay | Admitting: Obstetrics and Gynecology

## 2022-10-18 ENCOUNTER — Encounter (HOSPITAL_BASED_OUTPATIENT_CLINIC_OR_DEPARTMENT_OTHER): Payer: Self-pay | Admitting: Obstetrics and Gynecology

## 2022-10-18 ENCOUNTER — Ambulatory Visit: Payer: BC Managed Care – PPO | Admitting: Registered"

## 2022-10-18 NOTE — Progress Notes (Signed)
Received return tc from [place] with [facility]; discussed patient's case for pending surgery, Wilkie Aye will send msg to Dr. Timothy Lasso and contact [facility] PAT with updated information.   Frances Furbish, RN

## 2022-10-18 NOTE — Progress Notes (Signed)
Patient has outpatient elective surgery scheduled 10/22/2022. Upon review of chart, RN noted Hx of CHF, OSA, and HTN. OV note from OB/GYN on 10/13/22 showed b/p of 160/98 and 164/100 (previous OV b/p was 118/84). Per pt report to OB/GYN during OV, she has stopped carvedilol because NP has left cardiology practice. Upon review of MAR, pt should be taking spironlactone 50 mg daily and carvedilol 12.5 mg BID. Further review shows: - OV to [facility] on 03/18/22, notes state:  - EKG 01/07/22: SR, left atrial enlargement, left ventricular hypertrophy with secondary ST-T changes, lateral T-wave inversion, consider lateral/anterior ischemia  - echo 07/22/21: EF 57%, grade III diastolic dysfunction, elevated LAP, compared to previous study dysfunction progressed from grade II to grade III  - cornorary CTA: mild nonobstructive CAD, coronary calcium score 8.13, pt in 91% percentile for age and sex  - cardiology recommendations additional medications for CHF and to control HTN, f/u in 6 months around 09/2022  - no further f/u made by patient noted in chart, no additional medications agreed to by patient at this OV  RN discussed case with Dr. Maple Hudson of anesthesiology, recommends contacting surgeon's office to discuss case and have surgeon's office contact patient prior to surgery. RN contacted [facility], left message with office for scheduler to contact [facility] PAT.  Frances Furbish, RN

## 2022-10-20 ENCOUNTER — Encounter (HOSPITAL_BASED_OUTPATIENT_CLINIC_OR_DEPARTMENT_OTHER): Payer: Self-pay | Admitting: Obstetrics and Gynecology

## 2022-10-20 NOTE — Progress Notes (Signed)
TC to [facility] with [facility] at [phone]. Lorene Dy reports office made contact with patient; patient reports starting on Monday 10/18/22 she has started carvedilol and losartan. Pt reported to GYN office this is per cardiology recommendations. Lorene Dy stated Dr. Timothy Lasso is aware of updates. Lorene Dy also stated she has discussed with patient that if patient arrives DOS with HTN or is unstable, procedure may be cancelled. Patient is aware.   Frances Furbish, RN

## 2022-10-20 NOTE — Progress Notes (Signed)
Spoke w/ via phone for pre-op interview--- pt Lab needs dos----  cbc, bmp, t&s, urine preg,              Lab results------ current EKG in epic/ chart COVID test -----patient states asymptomatic no test needed Arrive at ------- 1015 on 10-22-2022 NPO after MN NO Solid Food.  Clear liquids from MN until---  09*15 Med rec completed Medications to take morning of surgery -----  coreg Diabetic medication ----- n/a Patient instructed no nail polish to be worn day of surgery Patient instructed to bring photo id and insurance card day of surgery Patient aware to have Driver (ride ) / caregiver  for 24 hours after surgery -- daughter, kaitlin Patient Special Instructions ----- asked to bring rescue inhaler dos Pre-Op special Istructions -----  please refer to previous progress notes by Frances Furbish RN about her reviewing chart w/ anesthesia , Dr Maple Hudson MDA,  and last note where she had spoken to Dr Timothy Lasso office Patient verbalized understanding of instructions that were given at this phone interview. Patient denies shortness of breath, chest pain, fever, cough at this phone interview.  Anesthesia Review:  pt had not been taking bp meds but restarted on 10-18-2022.  Pt is noncompliant cardiology instructions for medical therapy.  Per Dr Maple Hudson pt to assess dos.  Pt aware possible cancel if bp high.   HFpEF  PCP: Lauretta Chester NP Cardiologist : Dr Rosemary Holms [facility] 03-18-2022 epic) Chest x-ray :  CTA 07-12-2021 EKG : 01-07-2022 Echo : 07-22-2021 Stress test: no CT coronary w/ CTA:  03-15-2022 Cardiac Cath : no Activity level: denies sob Sleep Study/ CPAP :  Yes/  only uses average 2 times weekly  Blood Thinner/ Instructions /Last Dose: no ASA / Instructions/ Last Dose : no

## 2022-10-22 ENCOUNTER — Encounter (HOSPITAL_BASED_OUTPATIENT_CLINIC_OR_DEPARTMENT_OTHER): Payer: Self-pay | Admitting: Obstetrics and Gynecology

## 2022-10-22 ENCOUNTER — Ambulatory Visit (HOSPITAL_BASED_OUTPATIENT_CLINIC_OR_DEPARTMENT_OTHER): Payer: BC Managed Care – PPO | Admitting: Certified Registered Nurse Anesthetist

## 2022-10-22 ENCOUNTER — Encounter (HOSPITAL_BASED_OUTPATIENT_CLINIC_OR_DEPARTMENT_OTHER)
Admission: RE | Disposition: A | Discharge status: Home / Self Care | Payer: Self-pay | Source: Home / Self Care | Attending: Obstetrics and Gynecology

## 2022-10-22 ENCOUNTER — Other Ambulatory Visit: Payer: Self-pay

## 2022-10-22 ENCOUNTER — Ambulatory Visit (HOSPITAL_BASED_OUTPATIENT_CLINIC_OR_DEPARTMENT_OTHER)
Admission: RE | Admit: 2022-10-22 | Discharge: 2022-10-22 | Disposition: A | Discharge status: Home / Self Care | Payer: BC Managed Care – PPO | Attending: Obstetrics and Gynecology | Admitting: Obstetrics and Gynecology

## 2022-10-22 DIAGNOSIS — I2510 Atherosclerotic heart disease of native coronary artery without angina pectoris: Secondary | ICD-10-CM | POA: Diagnosis not present

## 2022-10-22 DIAGNOSIS — G4730 Sleep apnea, unspecified: Secondary | ICD-10-CM | POA: Insufficient documentation

## 2022-10-22 DIAGNOSIS — I5032 Chronic diastolic (congestive) heart failure: Secondary | ICD-10-CM | POA: Insufficient documentation

## 2022-10-22 DIAGNOSIS — Z01818 Encounter for other preprocedural examination: Secondary | ICD-10-CM

## 2022-10-22 DIAGNOSIS — N939 Abnormal uterine and vaginal bleeding, unspecified: Secondary | ICD-10-CM | POA: Diagnosis present

## 2022-10-22 DIAGNOSIS — I110 Hypertensive heart disease with heart failure: Secondary | ICD-10-CM | POA: Diagnosis not present

## 2022-10-22 HISTORY — PX: DILITATION & CURRETTAGE/HYSTROSCOPY WITH NOVASURE ABLATION: SHX5568

## 2022-10-22 HISTORY — DX: Heart failure, unspecified: I50.9

## 2022-10-22 HISTORY — DX: Atherosclerotic heart disease of native coronary artery without angina pectoris: I25.10

## 2022-10-22 HISTORY — DX: Obstructive sleep apnea (adult) (pediatric): G47.33

## 2022-10-22 HISTORY — DX: Unspecified diastolic (congestive) heart failure: I50.30

## 2022-10-22 HISTORY — DX: Atherosclerosis of aorta: I70.0

## 2022-10-22 HISTORY — DX: Presence of spectacles and contact lenses: Z97.3

## 2022-10-22 LAB — CBC
HCT: 38.7 % (ref 36.0–46.0)
Hemoglobin: 12.7 g/dL (ref 12.0–15.0)
MCH: 29.6 pg (ref 26.0–34.0)
MCHC: 32.8 g/dL (ref 30.0–36.0)
MCV: 90.2 fL (ref 80.0–100.0)
Platelets: 266 10*3/uL (ref 150–400)
RBC: 4.29 MIL/uL (ref 3.87–5.11)
RDW: 12.7 % (ref 11.5–15.5)
WBC: 6.2 10*3/uL (ref 4.0–10.5)
nRBC: 0 % (ref 0.0–0.2)

## 2022-10-22 LAB — BASIC METABOLIC PANEL
Anion gap: 6 (ref 5–15)
BUN: 15 mg/dL (ref 6–20)
CO2: 23 mmol/L (ref 22–32)
Calcium: 8.9 mg/dL (ref 8.9–10.3)
Chloride: 105 mmol/L (ref 98–111)
Creatinine, Ser: 0.85 mg/dL (ref 0.44–1.00)
GFR, Estimated: >60 mL/min (ref >60)
Glucose, Bld: 100 mg/dL — ABNORMAL HIGH (ref 70–99)
Potassium: 3.7 mmol/L (ref 3.5–5.1)
Sodium: 134 mmol/L — ABNORMAL LOW (ref 135–145)

## 2022-10-22 LAB — TYPE AND SCREEN
ABO/RH(D): O POS
Antibody Screen: NEGATIVE

## 2022-10-22 LAB — ABO/RH: ABO/RH(D): O POS

## 2022-10-22 SURGERY — DILATATION & CURETTAGE/HYSTEROSCOPY WITH NOVASURE ABLATION
Anesthesia: General | Site: Vagina

## 2022-10-22 MED ORDER — SCOPOLAMINE 1 MG/3DAYS TD PT72
MEDICATED_PATCH | TRANSDERMAL | Status: AC
  Filled 2022-10-22: qty 1

## 2022-10-22 MED ORDER — KETOROLAC TROMETHAMINE 30 MG/ML IJ SOLN
INTRAMUSCULAR | Status: AC
  Filled 2022-10-22: qty 3

## 2022-10-22 MED ORDER — PROPOFOL 10 MG/ML IV BOLUS
INTRAVENOUS | Status: DC | PRN
  Administered 2022-10-22: 200 mg via INTRAVENOUS

## 2022-10-22 MED ORDER — ACETAMINOPHEN 325 MG PO TABS: 325.0000 mg | ORAL_TABLET | ORAL | Status: DC | PRN

## 2022-10-22 MED ORDER — MIDAZOLAM HCL 2 MG/2ML IJ SOLN
INTRAMUSCULAR | Status: AC
  Filled 2022-10-22: qty 2

## 2022-10-22 MED ORDER — AMISULPRIDE (ANTIEMETIC) 5 MG/2ML IV SOLN: 10.0000 mg | Freq: Once | INTRAVENOUS | Status: DC | PRN

## 2022-10-22 MED ORDER — DEXAMETHASONE SODIUM PHOSPHATE 10 MG/ML IJ SOLN
INTRAMUSCULAR | Status: AC
  Filled 2022-10-22: qty 1

## 2022-10-22 MED ORDER — PROPOFOL 10 MG/ML IV BOLUS
INTRAVENOUS | Status: AC
  Filled 2022-10-22: qty 20

## 2022-10-22 MED ORDER — OXYCODONE HCL 5 MG PO TABS: 5.0000 mg | ORAL_TABLET | Freq: Once | ORAL | Status: DC | PRN

## 2022-10-22 MED ORDER — DEXAMETHASONE SODIUM PHOSPHATE 10 MG/ML IJ SOLN
INTRAMUSCULAR | Status: AC
  Filled 2022-10-22: qty 3

## 2022-10-22 MED ORDER — LIDOCAINE HCL (PF) 2 % IJ SOLN
INTRAMUSCULAR | Status: AC
  Filled 2022-10-22: qty 5

## 2022-10-22 MED ORDER — PROPOFOL 1000 MG/100ML IV EMUL
INTRAVENOUS | Status: AC
  Filled 2022-10-22: qty 100

## 2022-10-22 MED ORDER — ACETAMINOPHEN 325 MG PO TABS: 650.0000 mg | ORAL_TABLET | Freq: Four times a day (QID) | ORAL | Status: DC | PRN

## 2022-10-22 MED ORDER — KETOROLAC TROMETHAMINE 30 MG/ML IJ SOLN
INTRAMUSCULAR | Status: DC | PRN
  Administered 2022-10-22: 30 mg via INTRAVENOUS

## 2022-10-22 MED ORDER — FENTANYL CITRATE (PF) 250 MCG/5ML IJ SOLN
INTRAMUSCULAR | Status: DC | PRN
  Administered 2022-10-22 (×2): 25 ug via INTRAVENOUS
  Administered 2022-10-22: 50 ug via INTRAVENOUS

## 2022-10-22 MED ORDER — ACETAMINOPHEN 500 MG PO TABS
1000.0000 mg | ORAL_TABLET | ORAL | Status: AC
  Administered 2022-10-22: 1000 mg via ORAL

## 2022-10-22 MED ORDER — MIDAZOLAM HCL 2 MG/2ML IJ SOLN
INTRAMUSCULAR | Status: DC | PRN
  Administered 2022-10-22: 2 mg via INTRAVENOUS

## 2022-10-22 MED ORDER — IBUPROFEN 200 MG PO TABS: 600.0000 mg | ORAL_TABLET | Freq: Four times a day (QID) | ORAL | Status: DC | PRN

## 2022-10-22 MED ORDER — LIDOCAINE 2% (20 MG/ML) 5 ML SYRINGE
INTRAMUSCULAR | Status: DC | PRN
  Administered 2022-10-22: 40 mg via INTRAVENOUS

## 2022-10-22 MED ORDER — ONDANSETRON HCL 4 MG/2ML IJ SOLN
INTRAMUSCULAR | Status: AC
  Filled 2022-10-22: qty 6

## 2022-10-22 MED ORDER — EPHEDRINE SULFATE (PRESSORS) 50 MG/ML IJ SOLN
INTRAMUSCULAR | Status: DC | PRN
  Administered 2022-10-22 (×3): 5 mg via INTRAVENOUS

## 2022-10-22 MED ORDER — LACTATED RINGERS IV SOLN: INTRAVENOUS | Status: DC

## 2022-10-22 MED ORDER — PHENYLEPHRINE 80 MCG/ML (10ML) SYRINGE FOR IV PUSH (FOR BLOOD PRESSURE SUPPORT)
PREFILLED_SYRINGE | INTRAVENOUS | Status: AC
  Filled 2022-10-22: qty 10

## 2022-10-22 MED ORDER — FENTANYL CITRATE (PF) 100 MCG/2ML IJ SOLN
INTRAMUSCULAR | Status: AC
  Filled 2022-10-22: qty 2

## 2022-10-22 MED ORDER — DEXAMETHASONE SODIUM PHOSPHATE 10 MG/ML IJ SOLN
INTRAMUSCULAR | Status: DC | PRN
  Administered 2022-10-22: 10 mg via INTRAVENOUS

## 2022-10-22 MED ORDER — ACETAMINOPHEN 500 MG PO TABS
ORAL_TABLET | ORAL | Status: AC
  Filled 2022-10-22: qty 2

## 2022-10-22 MED ORDER — ONDANSETRON HCL 4 MG/2ML IJ SOLN
INTRAMUSCULAR | Status: AC
  Filled 2022-10-22: qty 2

## 2022-10-22 MED ORDER — PROMETHAZINE HCL 25 MG/ML IJ SOLN: 6.2500 mg | INTRAMUSCULAR | Status: DC | PRN

## 2022-10-22 MED ORDER — LIDOCAINE HCL 1 % IJ SOLN
INTRAMUSCULAR | Status: DC | PRN
  Administered 2022-10-22: 10 mL

## 2022-10-22 MED ORDER — FENTANYL CITRATE (PF) 100 MCG/2ML IJ SOLN: 25.0000 ug | INTRAMUSCULAR | Status: DC | PRN

## 2022-10-22 MED ORDER — SCOPOLAMINE 1 MG/3DAYS TD PT72
1.0000 | MEDICATED_PATCH | TRANSDERMAL | Status: DC
  Administered 2022-10-22: 1.5 mg via TRANSDERMAL

## 2022-10-22 MED ORDER — ACETAMINOPHEN 10 MG/ML IV SOLN: 1000.0000 mg | Freq: Once | INTRAVENOUS | Status: DC | PRN

## 2022-10-22 MED ORDER — SODIUM CHLORIDE 0.9 % IR SOLN
Status: DC | PRN
  Administered 2022-10-22: 3000 mL

## 2022-10-22 MED ORDER — ACETAMINOPHEN 160 MG/5ML PO SOLN: 325.0000 mg | ORAL | Status: DC | PRN

## 2022-10-22 MED ORDER — LIDOCAINE HCL (PF) 2 % IJ SOLN
INTRAMUSCULAR | Status: AC
  Filled 2022-10-22: qty 15

## 2022-10-22 MED ORDER — ONDANSETRON HCL 4 MG/2ML IJ SOLN
INTRAMUSCULAR | Status: DC | PRN
  Administered 2022-10-22: 4 mg via INTRAVENOUS

## 2022-10-22 MED ORDER — POVIDONE-IODINE 10 % EX SWAB: 2.0000 | Freq: Once | CUTANEOUS | Status: DC

## 2022-10-22 MED ORDER — OXYCODONE HCL 5 MG/5ML PO SOLN: 5.0000 mg | Freq: Once | ORAL | Status: DC | PRN

## 2022-10-22 SURGICAL SUPPLY — 12 items
ABLATOR SURESOUND NOVASURE (ABLATOR) ×1 IMPLANT
CATH ROBINSON RED A/P 16FR (CATHETERS) ×1 IMPLANT
GLOVE BIO SURGEON STRL SZ 6 (GLOVE) ×1 IMPLANT
GLOVE BIOGEL PI IND STRL 6.5 (GLOVE) ×1 IMPLANT
GLOVE BIOGEL PI IND STRL 7.0 (GLOVE) ×1 IMPLANT
GOWN STRL REUS W/ TWL LRG LVL3 (GOWN DISPOSABLE) ×2 IMPLANT
GOWN STRL REUS W/TWL LRG LVL3 (GOWN DISPOSABLE) ×2
KIT PROCEDURE FLUENT (KITS) ×1 IMPLANT
KIT TURNOVER CYSTO (KITS) ×1 IMPLANT
PACK VAGINAL MINOR WOMEN LF (CUSTOM PROCEDURE TRAY) ×1 IMPLANT
PAD OB MATERNITY 4.3X12.25 (PERSONAL CARE ITEMS) ×1 IMPLANT
TOWEL OR 17X24 6PK STRL BLUE (TOWEL DISPOSABLE) ×2 IMPLANT

## 2022-10-22 NOTE — Anesthesia Postprocedure Evaluation (Signed)
Anesthesia Post Note  Patient: Jessica [name]  Procedure(s) Performed: DILATATION & CURETTAGE/HYSTEROSCOPY WITH NOVASURE ABLATION (Vagina )     Patient location during evaluation: PACU Anesthesia Type: General Level of consciousness: awake and alert Pain management: pain level controlled Vital Signs Assessment: post-procedure vital signs reviewed and stable Respiratory status: spontaneous breathing, nonlabored ventilation, respiratory function stable and patient connected to nasal cannula oxygen Cardiovascular status: blood pressure returned to baseline and stable Postop Assessment: no apparent nausea or vomiting Anesthetic complications: no   No notable events documented.  Last Vitals:  Vitals:   10/22/22 1315 10/22/22 1330  BP: (!) 161/96 (!) 151/93  Pulse: 74 72  Resp: (!) 23 (!) 24  Temp:  36.6 C  SpO2: 93% 90%    Last Pain:  Vitals:   10/22/22 1330  TempSrc:   PainSc: 0-No pain                 Shelton Silvas

## 2022-10-22 NOTE — Transfer of Care (Signed)
Immediate Anesthesia Transfer of Care Note  Patient: Jessica [name]  Procedure(s) Performed: DILATATION & CURETTAGE/HYSTEROSCOPY WITH NOVASURE ABLATION (Vagina )  Patient Location: PACU  Anesthesia Type:General  Level of Consciousness: sedated  Airway & Oxygen Therapy: Patient Spontanous Breathing and Patient connected to face mask oxygen  Post-op Assessment: Report given to RN and Post -op Vital signs reviewed and stable  Post vital signs: Reviewed and stable  Last Vitals:  Vitals Value Taken Time  BP 144/87 10/22/22 1250  Temp    Pulse 65 10/22/22 1252  Resp 25 10/22/22 1252  SpO2 99 % 10/22/22 1252  Vitals shown include unvalidated device data.  Last Pain:  Vitals:   10/22/22 1008  TempSrc: Oral  PainSc: 0-No pain      Patients Stated Pain Goal: 4 (10/22/22 1008)  Complications: No notable events documented.

## 2022-10-22 NOTE — Anesthesia Preprocedure Evaluation (Signed)
Anesthesia Evaluation  Patient identified by MRN, date of birth, ID band Patient awake    Reviewed: Allergy & Precautions, NPO status , Patient's Chart, lab work & pertinent test results  Airway Mallampati: IV  TM Distance: >3 FB Neck ROM: Full    Dental  (+) Teeth Intact   Pulmonary sleep apnea and Continuous Positive Airway Pressure Ventilation    breath sounds clear to auscultation       Cardiovascular hypertension, Pt. on home beta blockers and Pt. on medications + CAD and +CHF   Rhythm:Regular Rate:Normal  Echo: Left ventricle cavity is normal in size. Moderate concentric hypertrophy  of the left ventricle. Normal global wall motion. Normal LV systolic  function with EF 57%. Doppler evidence of grade III (restrictive)  diastolic dysfunction, elevated LAP.  Left atrial cavity is mildly dilated.  No significant valvular abnormality.     Neuro/Psych negative neurological ROS  negative psych ROS   GI/Hepatic negative GI ROS, Neg liver ROS,,,  Endo/Other  negative endocrine ROS    Renal/GU negative Renal ROS     Musculoskeletal negative musculoskeletal ROS (+)    Abdominal   Peds  Hematology negative hematology ROS (+)   Anesthesia Other Findings   Reproductive/Obstetrics                              Anesthesia Physical Anesthesia Plan  ASA: 3  Anesthesia Plan: General   Post-op Pain Management: Minimal or no pain anticipated   Induction: Intravenous  PONV Risk Score and Plan: 4 or greater and Ondansetron, Dexamethasone, Midazolam and Scopolamine patch - Pre-op  Airway Management Planned: LMA  Additional Equipment: None  Intra-op Plan:   Post-operative Plan: Extubation in OR  Informed Consent: I have reviewed the patients History and Physical, chart, labs and discussed the procedure including the risks, benefits and alternatives for the proposed anesthesia with the  patient or authorized representative who has indicated his/her understanding and acceptance.     Dental advisory given  Plan Discussed with: CRNA  Anesthesia Plan Comments:          Anesthesia Quick Evaluation

## 2022-10-22 NOTE — H&P (Signed)
Jessica [name] is an 51 y.o. female P1 with heavy menses.   06/30/22 new pt visit "Periods: new heavy periods over the past year, first 2 days are very heavy. Every 3-4 hours changes. Never been told she had fibroids or anything wrong with uterus. "  07/21/22 Reviewed GYN [place]: Physician interpretation - [place]YN [place]: 6.7cm anteverted uterus, EMS 3.57mm, left ovary normal, right ovary normal. Trace fluid in cervix. Possible adenomyosis? EMB done without complication Endometrial Biopsy: PROLIFERATIVE ENDOMETRIUM. NO HYPERPLASIA, CARCINOMA OR ENDOMETRITIS.  10/13/22 preop visit"  BP today 160/98, 164/100. Last office visit BP 118/84. States she has stopped taking her carvedilol and her cardiology NP has left the practice. Discussed need to call cardiology practice ASAP, request clearance. Anesthesiologist may not be comfortable proceeding and may need to reschedule. She states [facility] with [facility] and no issues.   Menstrual History: Patient's last menstrual period was 09/21/2022 (approximate).    Past Medical History:  Diagnosis Date   Aortic atherosclerosis (HCC)    per coronary CTA   Coronary artery disease    cardiologist--- dr Rosemary Holms;   per coronary CT w/ CTA 03-15-2022  mild nonob cad   Heart failure with preserved ejection fraction [facility])    cardiologist--- patwardhan;   per echo 07-22-2021  EF 57%, grade 3 diastolic dysfunction (progressed from grade 2), elevated LAP   Hyperlipidemia    Hypertension    followed by pcp  and cardiologist   OSA on CPAP    10-20-2022  per pt uses as needed,  stated average 2 times per week   (followed by dr Frances Furbish)   Wears glasses     Past Surgical History:  Procedure Laterality Date   BUNIONECTOMY Left    06/ 2022 w/ hammertoe correction and 06/ 2023 left bunion both @ [facility]   WRIST GANGLION EXCISION Left 1993    Family History  Problem Relation Age of Onset   Hypertension Mother    Hypertension Father     Heart disease Sister    Colon cancer Brother     Social History:  reports that she has never smoked. She has never used smokeless tobacco. She reports that she does not currently use alcohol. She reports that she does not use drugs.  Allergies:  Allergies  Allergen Reactions   Amlodipine Besylate Swelling    Other reaction(s): edema   Aspirin Nausea And Vomiting   Penicillins Hives   Triamterene-Hctz Nausea Only    Other reaction(s): nausea    No medications prior to admission.    Review of Systems  Constitutional:  Negative for fever.  HENT:  Negative for sore throat.   Eyes:  Negative for visual disturbance.  Respiratory:  Negative for shortness of breath.   Cardiovascular:  Negative for chest pain.  Gastrointestinal:  Negative for abdominal pain.  Genitourinary:  Positive for menstrual problem.  Musculoskeletal:  Negative for neck pain.  Skin:  Negative for rash.  Neurological:  Negative for headaches.  Psychiatric/Behavioral:  Negative for suicidal ideas.     Height 5\' 5"  (1.651 m), weight 127 kg, last menstrual period 09/21/2022. Physical Exam  Chaperone Chaperone: present  Constitutional General Appearance: healthy-appearing, well-nourished, well-developed  Psychiatric Orientation: to time, to place, to person Mood and Affect: active and alert, normal mood, normal affect  Abdomen Auscultation/Inspection/Palpation: normal bowel sounds, soft, non-distended, no tenderness, no hepatomegaly, no splenomegaly, no masses, no CVA tenderness Hernia: none palpated  Female Genitalia Vulva: no  masses, no atrophy, no lesions Bladder/Urethra: normal meatus, no urethral discharge, no urethral mass, bladder non distended Vagina no tenderness, no erythema, no abnormal vaginal discharge, no vesicle(s) or ulcers, no cystocele, no rectocele Cervix: grossly normal, no discharge, no cervical motion tenderness Uterus: normal size, normal shape, midline, mobile, non-tender, no  uterine prolapse Adnexa/Parametria: no parametrial tenderness, no parametrial mass, no adnexal tenderness, no ovarian mass  No results found for this or any previous visit (from the past 24 hour(s)).  No results found.  Assessment/Plan: 51Y P1 with heavy menses - Plan: hysteroscopy, D&C, Novasure endometrial ablation - Informed consent obtained. Reviewed risk of infection, bleeding, uterine perforation, failure to complete procedure, failure to achieve desired result. Reviewed need for reliable contraception following procedure. All questions answered.  - Hypertension: patient has restarted her antihypertensive medications. She is aware that if elevated blood pressure on day of surgery, may not be safe to proceed with case. Stressed importance of adherence to cardiology care.   Charlett Nose 10/22/2022, 7:42 AM

## 2022-10-22 NOTE — Brief Op Note (Signed)
10/22/2022  12:45 PM  PATIENT:  Jessica [name]  51 y.o. female  PRE-OPERATIVE DIAGNOSIS:  abnormal uterine bleeding  POST-OPERATIVE DIAGNOSIS:  abnormal uterine bleeding  PROCEDURE:  Procedure(s): DILATATION & CURETTAGE/HYSTEROSCOPY WITH NOVASURE ABLATION (N/A)  SURGEON:  Surgeon(s) and Role:    * Charlett Nose, MD - Primary  ANESTHESIA:   local and general  EBL:  5 mL   BLOOD ADMINISTERED:none  DRAINS: none   LOCAL MEDICATIONS USED:  LIDOCAINE  and Amount: 10 ml  SPECIMEN:  Source of Specimen:  endometrial curettings  DISPOSITION OF SPECIMEN:  PATHOLOGY  COUNTS:  YES  TOURNIQUET:  * No tourniquets in log *  DICTATION: .Note written in EPIC  PLAN OF CARE: Discharge to home after PACU  PATIENT DISPOSITION:  PACU - hemodynamically stable.   Delay start of Pharmacological VTE agent (>24hrs) due to surgical blood loss or risk of bleeding: not applicable

## 2022-10-22 NOTE — Op Note (Signed)
  10/22/2022   12:45 PM   PATIENT:  Jessica [name]  51 y.o. female   PRE-OPERATIVE DIAGNOSIS:  abnormal uterine bleeding   POST-OPERATIVE DIAGNOSIS:  abnormal uterine bleeding   PROCEDURE:  Procedure(s): DILATATION & CURETTAGE/HYSTEROSCOPY WITH NOVASURE ABLATION (N/A)   SURGEON:  Surgeon(s) and Role:    * Charlett Nose, MD - Primary   ANESTHESIA:   local and general   EBL:  5 mL    BLOOD ADMINISTERED:none   DRAINS: none    LOCAL MEDICATIONS USED:  LIDOCAINE  and Amount: 10 ml   SPECIMEN:  Source of Specimen:  endometrial curettings   DISPOSITION OF SPECIMEN:  PATHOLOGY   COUNTS:  YES   PLAN OF CARE: Discharge to home after PACU   PATIENT DISPOSITION:  PACU - hemodynamically stable.  COMPLICATIONS: none  FINDINGS: anteverted uterus sounded to 9.5cm. On hysterscopic view, bilateral ostia visualized.  Diffusely thin appearing endometrium. Total fluid deficit .  Uterine cavity length 6cm Uterine cavity width 3.0cm Burn time 1:03  PROCEDURE IN DETAIL:  The patient was appropriately consented and taken to the operating room where anesthesia was administered without difficulty. Thromboguards were placed and connected. She was placed in the dorsal lithotomy position in stirrups. She was examined under anesthesia and found to have an anteverted uterus. The patient was then prepped and draped in normal sterile fashion. A long speculum was inserted into the vagina. A single-tooth tenaculum was used to grasp the anterior lip of the cervix. A paracervical block was obtained by injecting a total of 5mL of 1% lidocaine at the 4 and 8 o'clock positions at the cervicovaginal junction. The cervical length was measured to be 3.5cm. The uterus was sounded to 9.5cm. The cervical os was sequentially dilated using Pratt dilators to accommodate the hysteroscope. The hysteroscope was introduced under direct visualization and the uterus was distended with normal saline. Bilateral  ostia were visualized. Findings as noted above. The hysteroscope was removed. A sharp curette was inserted and endometrial curettings collected on Telfa, which was sent for pathology.  The Novasure endometrial ablation procedure was then performed. The array was first deployed and inspected. The device was then introduced into the uterus. The uterine cavity width was measured to be 3.0cm. The cavity assessment was successfully completed followed by initiation of the ablation cycle which was completed without difficulty. The device burned for 1 min 3 seconds. A hysteroscope was introduced into the uterine cavity to reveal charred endometrium and adequate ablation.   The tenaculum was removed from the cervix and good hemostasis was noted. All instruments were removed. The patient tolerated the procedure well and was transferred to the recovery room in stable condition.   Derl Barrow, MD 10/22/22 12:48 PM

## 2022-10-22 NOTE — Anesthesia Procedure Notes (Signed)
Procedure Name: LMA Insertion Date/Time: 10/22/2022 12:13 PM  Performed by: Dairl Ponder, CRNAPre-anesthesia Checklist: Patient identified, Emergency Drugs available, Suction available and Patient being monitored Patient Re-evaluated:Patient Re-evaluated prior to induction Oxygen Delivery Method: Circle System Utilized Preoxygenation: Pre-oxygenation with 100% oxygen Induction Type: IV induction Ventilation: Mask ventilation without difficulty LMA: LMA inserted LMA Size: 4.0 Number of attempts: 1 Airway Equipment and Method: Bite block Placement Confirmation: positive ETCO2 Tube secured with: Tape Dental Injury: Teeth and Oropharynx as per pre-operative assessment

## 2022-10-22 NOTE — Interval H&P Note (Signed)
History and Physical Interval Note:  10/22/2022 11:50 AM  Jessica [name]  has presented today for surgery, with the diagnosis of excessive and frequent menstruation.  The various methods of treatment have been discussed with the patient and family. After consideration of risks, benefits and other options for treatment, the patient has consented to  Procedure(s): DILATATION & CURETTAGE/HYSTEROSCOPY WITH NOVASURE ABLATION (N/A) as a surgical intervention.  The patient's history has been reviewed, patient examined.  I have reviewed the patient's chart and labs.  Questions were answered to the patient's satisfaction.     Charlett Nose

## 2022-10-22 NOTE — Discharge Instructions (Signed)
No acetaminophen/Tylenol until after 4:12pm today if needed for pain.    No ibuprofen, Advil, Aleve, Motrin, ketorolac, meloxicam, naproxen, or other NSAIDS until after 6:30pm today if needed for pain.     Post Anesthesia Home Care Instructions  Activity: Get plenty of rest for the remainder of the day. A responsible individual must stay with you for 24 hours following the procedure.  For the next 24 hours, DO NOT: -Drive a car -Advertising copywriter -Drink alcoholic beverages -Take any medication unless instructed by your physician -Make any legal decisions or sign important papers.  Meals: Start with liquid foods such as gelatin or soup. Progress to regular foods as tolerated. Avoid greasy, spicy, heavy foods. If nausea and/or vomiting occur, drink only clear liquids until the nausea and/or vomiting subsides. Call your physician if vomiting continues.  Special Instructions/Symptoms: Your throat may feel dry or sore from the anesthesia or the breathing tube placed in your throat during surgery. If this causes discomfort, gargle with warm salt water. The discomfort should disappear within 24 hours.  If you had a scopolamine patch placed behind your ear for the management of post- operative nausea and/or vomiting:  1. The medication in the patch is effective for 72 hours, after which it should be removed.  Wrap patch in a tissue and discard in the trash. Wash hands thoroughly with soap and water. 2. You may remove the patch earlier than 72 hours if you experience unpleasant side effects which may include dry mouth, dizziness or visual disturbances. 3. Avoid touching the patch. Wash your hands with soap and water after contact with the patch.

## 2022-10-25 ENCOUNTER — Encounter (HOSPITAL_BASED_OUTPATIENT_CLINIC_OR_DEPARTMENT_OTHER): Payer: Self-pay | Admitting: Obstetrics and Gynecology

## 2022-10-25 LAB — SURGICAL PATHOLOGY

## 2023-04-05 ENCOUNTER — Encounter (HOSPITAL_BASED_OUTPATIENT_CLINIC_OR_DEPARTMENT_OTHER): Payer: Self-pay

## 2023-04-05 ENCOUNTER — Other Ambulatory Visit: Payer: Self-pay

## 2023-04-05 ENCOUNTER — Emergency Department (HOSPITAL_BASED_OUTPATIENT_CLINIC_OR_DEPARTMENT_OTHER)
Admission: EM | Admit: 2023-04-05 | Discharge: 2023-04-05 | Disposition: A | Discharge status: Home / Self Care | Payer: BC Managed Care – PPO | Attending: Emergency Medicine | Admitting: Emergency Medicine

## 2023-04-05 ENCOUNTER — Emergency Department (HOSPITAL_BASED_OUTPATIENT_CLINIC_OR_DEPARTMENT_OTHER): Payer: BC Managed Care – PPO | Admitting: Radiology

## 2023-04-05 DIAGNOSIS — I10 Essential (primary) hypertension: Secondary | ICD-10-CM | POA: Diagnosis present

## 2023-04-05 DIAGNOSIS — I509 Heart failure, unspecified: Secondary | ICD-10-CM | POA: Diagnosis not present

## 2023-04-05 LAB — BASIC METABOLIC PANEL
Anion gap: 9 (ref 5–15)
BUN: 13 mg/dL (ref 6–20)
CO2: 25 mmol/L (ref 22–32)
Calcium: 9.5 mg/dL (ref 8.9–10.3)
Chloride: 102 mmol/L (ref 98–111)
Creatinine, Ser: 0.86 mg/dL (ref 0.44–1.00)
GFR, Estimated: >60 mL/min (ref >60)
Glucose, Bld: 94 mg/dL (ref 70–99)
Potassium: 3.9 mmol/L (ref 3.5–5.1)
Sodium: 136 mmol/L (ref 135–145)

## 2023-04-05 LAB — CBC
HCT: 41.2 % (ref 36.0–46.0)
Hemoglobin: 13.8 g/dL (ref 12.0–15.0)
MCH: 29.9 pg (ref 26.0–34.0)
MCHC: 33.5 g/dL (ref 30.0–36.0)
MCV: 89.2 fL (ref 80.0–100.0)
Platelets: 283 10*3/uL (ref 150–400)
RBC: 4.62 MIL/uL (ref 3.87–5.11)
RDW: 12.8 % (ref 11.5–15.5)
WBC: 9.4 10*3/uL (ref 4.0–10.5)
nRBC: 0 % (ref 0.0–0.2)

## 2023-04-05 LAB — URINALYSIS, ROUTINE W REFLEX MICROSCOPIC
Bilirubin Urine: NEGATIVE
Glucose, UA: NEGATIVE mg/dL
Ketones, ur: NEGATIVE mg/dL
Leukocytes,Ua: NEGATIVE
Nitrite: NEGATIVE
Specific Gravity, Urine: 1.027 (ref 1.005–1.030)
pH: 5.5 (ref 5.0–8.0)

## 2023-04-05 LAB — TROPONIN I (HIGH SENSITIVITY): Troponin I (High Sensitivity): 17 ng/L (ref <18)

## 2023-04-05 LAB — PREGNANCY, URINE: Preg Test, Ur: NEGATIVE

## 2023-04-05 MED ORDER — LOSARTAN POTASSIUM 25 MG PO TABS
50.0000 mg | ORAL_TABLET | Freq: Every day | ORAL | Status: DC
  Administered 2023-04-05: 50 mg via ORAL
  Filled 2023-04-05: qty 2

## 2023-04-05 MED ORDER — CARVEDILOL 12.5 MG PO TABS: 12.5000 mg | ORAL_TABLET | Freq: Two times a day (BID) | ORAL | Status: DC

## 2023-04-05 MED ORDER — LOSARTAN POTASSIUM 25 MG PO TABS: 50.0000 mg | ORAL_TABLET | Freq: Every day | ORAL | Status: DC

## 2023-04-05 MED ORDER — CARVEDILOL 12.5 MG PO TABS
12.5000 mg | ORAL_TABLET | Freq: Two times a day (BID) | ORAL | Status: DC
  Administered 2023-04-05: 12.5 mg via ORAL
  Filled 2023-04-05: qty 1

## 2023-04-05 NOTE — Discharge Instructions (Addendum)
Evaluation today revealed that your blood pressure was high suggesting that your hypertension is poorly controlled at this time.  I do recommend that you take your Coreg and losartan as prescribed daily as you are at risk for heart attack and stroke if your blood pressure is poorly controlled.  If you have new chest pain, headache, visual disturbance, shortness of breath or urinary changes or any other concerning symptom please return emergency department further evaluation.

## 2023-04-05 NOTE — ED Notes (Signed)
Pt verbalized understanding of d/c instructions, meds, and followup care. Denies questions. VSS, no distress noted. Steady gait to exit with all belongings. Understands to take coreg and losartan today. Needs to follow up with PCP for further HTN care.

## 2023-04-05 NOTE — ED Triage Notes (Signed)
Patient here POV from Home.  Endorses being at [facility] today for Evaluation of Pink Eye. Sent to ED for Evaluation of BP and "Abnormal ECG".   No CP. No SOB. No Complaints. Takes BP Medication PRN (None today).  NAD Noted During Triage. A&Ox4. GCS 15. Ambulatory.

## 2023-04-05 NOTE — ED Provider Notes (Signed)
[place] EMERGENCY DEPARTMENT AT [facility] Provider Note   CSN: [id number] Arrival date & time: 04/05/23  1358     History  Chief Complaint  Patient presents with   Hypertension   HPI Jessica [name] is a 52 y.o. female with hypertension, hyperlipidemia, CAD and heart failure presenting for hypertension.  States she was seen by urgent care today for evaluation of pinkeye.  Was found to have an elevated blood pressure" abnormal EKG".  States she is prescribed carvedilol and losartan for high blood pressure but she only takes this as needed.  States "it really does not work well".  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  Denies chest pain, shortness of breath, headache, abdominal pain and urinary changes.  Did not take her blood pressure medication today.   Hypertension       Home Medications Prior to Admission medications   Medication Sig Start Date End Date Taking? Authorizing Provider  tobramycin (TOBREX) 0.3 % ophthalmic solution Place 1 drop into both eyes every 4 (four) hours. 04/05/23  Yes [provider]  acetaminophen (TYLENOL) 325 MG tablet Take 2 tablets (650 mg total) by mouth every 6 (six) hours as needed. 10/22/22   Charlett Nose, MD  albuterol (VENTOLIN HFA) 108 (90 Base) MCG/ACT inhaler Inhale 1 puff into the lungs every 4 (four) hours as needed for shortness of breath or wheezing. 01/22/22   [provider]  atorvastatin (LIPITOR) 40 MG tablet Take 40 mg by mouth daily. 08/17/21   [provider]  carvedilol (COREG) 12.5 MG tablet TAKE 1 TABLET(12.5 MG) BY MOUTH TWICE DAILY WITH A MEAL Patient taking differently: Take 12.5 mg by mouth 2 (two) times daily with a meal. TAKE 1 TABLET(12.5 MG) BY MOUTH TWICE DAILY WITH A MEAL 02/09/22   Cantwell, Celeste C, PA-C  Cholecalciferol (VITAMIN D-3 PO) Take 1  capsule by mouth daily.    [provider]  ezetimibe (ZETIA) 10 MG tablet Take 1 tablet (10 mg total) by mouth daily. Patient not taking: Reported on 10/20/2022 03/18/22 07/16/22  Cantwell, Park Meo C, PA-C  ibuprofen (ADVIL) 200 MG tablet Take 3 tablets (600 mg total) by mouth every 6 (six) hours as needed. 10/22/22   Charlett Nose, MD  losartan (COZAAR) 50 MG tablet Take 50 mg by mouth at bedtime.    [provider]  spironolactone (ALDACTONE) 50 MG tablet TAKE 1 TABLET(50 MG) BY MOUTH DAILY Patient not taking: Reported on 10/20/2022 02/09/22   Elvin So C, PA-C      Allergies    Amlodipine besylate, Aspirin, Penicillins, and Triamterene-hctz    Review of Systems   See HPI for pertinent positives   Physical Exam   Vitals:   04/05/23 1600 04/05/23 1615  BP: (!) 191/111 (!) 189/105  Pulse: 74 75  Resp: (!) 25 (!) 24  Temp:    SpO2: 97% 96%    CONSTITUTIONAL:  well-appearing, NAD NEURO:  Alert and oriented x 3, CN 3-12 grossly intact EYES:  eyes equal and reactive ENT/NECK:  Supple, no stridor  CARDIO:  regular rate and rhythm, appears well-perfused  PULM:  No respiratory distress, CTAB GI/GU:  non-distended, soft, non tender MSK/SPINE:  No gross deformities, no edema, moves all extremities  SKIN:  no rash, atraumatic  *Additional and/or pertinent findings included in  MDM below  ED Results / Procedures / Treatments   Labs (all labs ordered are listed, but only abnormal results are displayed) Labs Reviewed  URINALYSIS, ROUTINE W REFLEX MICROSCOPIC - Abnormal; Notable for the following components:      Result Value   Hgb urine dipstick TRACE (*)    Protein, ur TRACE (*)    Bacteria, UA RARE (*)    All other components within normal limits  BASIC METABOLIC PANEL  CBC  PREGNANCY, URINE  TROPONIN I (HIGH SENSITIVITY)  TROPONIN I (HIGH SENSITIVITY)    EKG EKG Interpretation  Date/Time:  Tuesday April 05 2023 14:06:56 EDT Ventricular  Rate:  87 PR Interval:  144 QRS Duration: 85 QT Interval:  369 QTC Calculation: 444 R Axis:   56 Text Interpretation: Sinus rhythm Abnormal T, consider ischemia, diffuse leads No significant change since last tracing Confirmed by Elayne Snare ([id number]) on 04/05/2023 3:36:42 PM  Radiology DG Chest 2 View  Result Date: 04/05/2023 CLINICAL DATA:  Hypertension. EXAM: CHEST - 2 VIEW COMPARISON:  July 12, 2021. FINDINGS: Stable cardiomegaly. Both lungs are clear. The visualized skeletal structures are unremarkable. IMPRESSION: No active cardiopulmonary disease. Electronically Signed   By: Lupita Raider M.D.   On: 04/05/2023 15:54    Procedures Procedures    Medications Ordered in ED Medications  carvedilol (COREG) tablet 12.5 mg (12.5 mg Oral Given 04/05/23 1545)  losartan (COZAAR) tablet 50 mg (50 mg Oral Given 04/05/23 1545)    ED Course/ Medical Decision Making/ A&P                             Medical Decision Making Amount and/or Complexity of Data Reviewed Labs: ordered. Radiology: ordered.  Risk Prescription drug management.   52 year old female who is well-appearing presenting for elevated blood pressure.  Exam is unremarkable.  DDx includes hypertensive emergency, CHF, ACS, renal pathology.  Workup was overall unremarkable.  Ordered her home medications for blood pressure as prescribed.  Blood pressure did trend down during this encounter.  Patient remained asymptomatic on reassessment.  Advised that she does need to take her blood pressure as prescribed daily as poorly controlled hypertension puts her at risk for MI, stroke and heart failure.  Vital stable at discharge.  Discussed return cautions.  Advised her to follow-up with her PCP regarding her poorly controlled hypertension.        Final Clinical Impression(s) / ED Diagnoses Final diagnoses:  Hypertension, unspecified type    Rx / DC Orders ED Discharge Orders     None         Gareth Eagle,  PA-C 04/05/23 1653    Ernie Avena, MD 04/05/23 2014

## 2023-06-12 ENCOUNTER — Emergency Department (HOSPITAL_BASED_OUTPATIENT_CLINIC_OR_DEPARTMENT_OTHER): Payer: BC Managed Care – PPO

## 2023-06-12 ENCOUNTER — Observation Stay (HOSPITAL_BASED_OUTPATIENT_CLINIC_OR_DEPARTMENT_OTHER)
Admission: EM | Admit: 2023-06-12 | Discharge: 2023-06-13 | Disposition: A | Discharge status: Home / Self Care | Payer: BC Managed Care – PPO | Attending: Internal Medicine | Admitting: Internal Medicine

## 2023-06-12 ENCOUNTER — Encounter (HOSPITAL_BASED_OUTPATIENT_CLINIC_OR_DEPARTMENT_OTHER): Payer: Self-pay | Admitting: Emergency Medicine

## 2023-06-12 DIAGNOSIS — I169 Hypertensive crisis, unspecified: Principal | ICD-10-CM | POA: Insufficient documentation

## 2023-06-12 DIAGNOSIS — I110 Hypertensive heart disease with heart failure: Secondary | ICD-10-CM | POA: Diagnosis not present

## 2023-06-12 DIAGNOSIS — K589 Irritable bowel syndrome without diarrhea: Secondary | ICD-10-CM | POA: Insufficient documentation

## 2023-06-12 DIAGNOSIS — I5031 Acute diastolic (congestive) heart failure: Secondary | ICD-10-CM | POA: Insufficient documentation

## 2023-06-12 DIAGNOSIS — Z79899 Other long term (current) drug therapy: Secondary | ICD-10-CM | POA: Insufficient documentation

## 2023-06-12 DIAGNOSIS — Z6836 Body mass index (BMI) 36.0-36.9, adult: Secondary | ICD-10-CM | POA: Diagnosis not present

## 2023-06-12 DIAGNOSIS — J810 Acute pulmonary edema: Secondary | ICD-10-CM

## 2023-06-12 DIAGNOSIS — I161 Hypertensive emergency: Secondary | ICD-10-CM | POA: Diagnosis not present

## 2023-06-12 DIAGNOSIS — G4733 Obstructive sleep apnea (adult) (pediatric): Secondary | ICD-10-CM

## 2023-06-12 DIAGNOSIS — R519 Headache, unspecified: Secondary | ICD-10-CM | POA: Diagnosis present

## 2023-06-12 DIAGNOSIS — I5030 Unspecified diastolic (congestive) heart failure: Secondary | ICD-10-CM | POA: Diagnosis present

## 2023-06-12 DIAGNOSIS — K219 Gastro-esophageal reflux disease without esophagitis: Secondary | ICD-10-CM | POA: Insufficient documentation

## 2023-06-12 DIAGNOSIS — I160 Hypertensive urgency: Secondary | ICD-10-CM | POA: Diagnosis present

## 2023-06-12 DIAGNOSIS — E6609 Other obesity due to excess calories: Secondary | ICD-10-CM

## 2023-06-12 DIAGNOSIS — I2510 Atherosclerotic heart disease of native coronary artery without angina pectoris: Secondary | ICD-10-CM | POA: Insufficient documentation

## 2023-06-12 LAB — COMPREHENSIVE METABOLIC PANEL
ALT: 20 U/L (ref 0–44)
AST: 22 U/L (ref 15–41)
Albumin: 4.2 g/dL (ref 3.5–5.0)
Alkaline Phosphatase: 85 U/L (ref 38–126)
Anion gap: 10 (ref 5–15)
BUN: 14 mg/dL (ref 6–20)
CO2: 24 mmol/L (ref 22–32)
Calcium: 10.8 mg/dL — ABNORMAL HIGH (ref 8.9–10.3)
Chloride: 99 mmol/L (ref 98–111)
Creatinine, Ser: 0.94 mg/dL (ref 0.44–1.00)
GFR, Estimated: >60 mL/min (ref >60)
Glucose, Bld: 109 mg/dL — ABNORMAL HIGH (ref 70–99)
Potassium: 3.6 mmol/L (ref 3.5–5.1)
Sodium: 133 mmol/L — ABNORMAL LOW (ref 135–145)
Total Bilirubin: 1.6 mg/dL — ABNORMAL HIGH (ref 0.3–1.2)
Total Protein: 8.6 g/dL — ABNORMAL HIGH (ref 6.5–8.1)

## 2023-06-12 LAB — CBC
HCT: 43.2 % (ref 36.0–46.0)
Hemoglobin: 14.8 g/dL (ref 12.0–15.0)
MCH: 29.4 pg (ref 26.0–34.0)
MCHC: 34.3 g/dL (ref 30.0–36.0)
MCV: 85.9 fL (ref 80.0–100.0)
Platelets: 305 10*3/uL (ref 150–400)
RBC: 5.03 MIL/uL (ref 3.87–5.11)
RDW: 12.7 % (ref 11.5–15.5)
WBC: 9.4 10*3/uL (ref 4.0–10.5)
nRBC: 0 % (ref 0.0–0.2)

## 2023-06-12 LAB — I-STAT VENOUS BLOOD GAS, ED
Acid-Base Excess: 2 mmol/L (ref 0.0–2.0)
Acid-Base Excess: 0 mmol/L (ref 0.0–2.0)
Bicarbonate: 26.8 mmol/L (ref 20.0–28.0)
Bicarbonate: 23.5 mmol/L (ref 20.0–28.0)
Calcium, Ion: 1.25 mmol/L (ref 1.15–1.40)
Calcium, Ion: 1.21 mmol/L (ref 1.15–1.40)
HCT: 46 % (ref 36.0–46.0)
HCT: 44 % (ref 36.0–46.0)
Hemoglobin: 15.6 g/dL — ABNORMAL HIGH (ref 12.0–15.0)
Hemoglobin: 15 g/dL (ref 12.0–15.0)
O2 Saturation: 87 %
O2 Saturation: 93 %
Patient temperature: 97.7
Patient temperature: 98.7
Potassium: 3.9 mmol/L (ref 3.5–5.1)
Potassium: 3.7 mmol/L (ref 3.5–5.1)
Sodium: 136 mmol/L (ref 135–145)
Sodium: 135 mmol/L (ref 135–145)
TCO2: 28 mmol/L (ref 22–32)
TCO2: 25 mmol/L (ref 22–32)
pCO2, Ven: 40.6 mmHg — ABNORMAL LOW (ref 44–60)
pCO2, Ven: 34.7 mmHg — ABNORMAL LOW (ref 44–60)
pH, Ven: 7.425 (ref 7.25–7.43)
pH, Ven: 7.44 — ABNORMAL HIGH (ref 7.25–7.43)
pO2, Ven: 50 mmHg — ABNORMAL HIGH (ref 32–45)
pO2, Ven: 63 mmHg — ABNORMAL HIGH (ref 32–45)

## 2023-06-12 LAB — URINALYSIS, ROUTINE W REFLEX MICROSCOPIC
Bilirubin Urine: NEGATIVE
Glucose, UA: NEGATIVE mg/dL
Ketones, ur: NEGATIVE mg/dL
Leukocytes,Ua: NEGATIVE
Nitrite: NEGATIVE
Protein, ur: 100 mg/dL — AB
Specific Gravity, Urine: 1.025 (ref 1.005–1.030)
pH: 7.5 (ref 5.0–8.0)

## 2023-06-12 LAB — URINALYSIS, MICROSCOPIC (REFLEX)

## 2023-06-12 LAB — ETHANOL: Alcohol, Ethyl (B): <10 mg/dL (ref <10)

## 2023-06-12 LAB — PROTIME-INR
INR: 0.9 (ref 0.8–1.2)
Prothrombin Time: 12.7 seconds (ref 11.4–15.2)

## 2023-06-12 LAB — DIFFERENTIAL
Abs Immature Granulocytes: 0.03 10*3/uL (ref 0.00–0.07)
Basophils Absolute: 0.1 10*3/uL (ref 0.0–0.1)
Basophils Relative: 1 %
Eosinophils Absolute: 0.2 10*3/uL (ref 0.0–0.5)
Eosinophils Relative: 2 %
Immature Granulocytes: 0 %
Lymphocytes Relative: 34 %
Lymphs Abs: 3.1 10*3/uL (ref 0.7–4.0)
Monocytes Absolute: 0.6 10*3/uL (ref 0.1–1.0)
Monocytes Relative: 7 %
Neutro Abs: 5.4 10*3/uL (ref 1.7–7.7)
Neutrophils Relative %: 56 %

## 2023-06-12 LAB — BRAIN NATRIURETIC PEPTIDE: B Natriuretic Peptide: 286.8 pg/mL — ABNORMAL HIGH (ref 0.0–100.0)

## 2023-06-12 LAB — CBG MONITORING, ED: Glucose-Capillary: 86 mg/dL (ref 70–99)

## 2023-06-12 LAB — PREGNANCY, URINE: Preg Test, Ur: NEGATIVE

## 2023-06-12 LAB — APTT: aPTT: 28 seconds (ref 24–36)

## 2023-06-12 MED ORDER — NITROGLYCERIN IN D5W 200-5 MCG/ML-% IV SOLN
0.0000 ug/min | INTRAVENOUS | Status: DC
  Administered 2023-06-12: 50 ug/min via INTRAVENOUS
  Filled 2023-06-12: qty 250

## 2023-06-12 MED ORDER — ONDANSETRON HCL 4 MG/2ML IJ SOLN
4.0000 mg | Freq: Once | INTRAMUSCULAR | Status: AC
  Administered 2023-06-12: 4 mg via INTRAVENOUS
  Filled 2023-06-12: qty 2

## 2023-06-12 MED ORDER — FUROSEMIDE 10 MG/ML IJ SOLN
60.0000 mg | Freq: Once | INTRAMUSCULAR | Status: AC
  Administered 2023-06-12: 60 mg via INTRAVENOUS
  Filled 2023-06-12: qty 6

## 2023-06-12 MED ORDER — HYDRALAZINE HCL 20 MG/ML IJ SOLN
5.0000 mg | Freq: Once | INTRAMUSCULAR | Status: AC
  Administered 2023-06-12: 5 mg via INTRAVENOUS
  Filled 2023-06-12: qty 1

## 2023-06-12 MED ORDER — LORAZEPAM 2 MG/ML IJ SOLN
1.0000 mg | Freq: Once | INTRAMUSCULAR | Status: AC
  Administered 2023-06-12: 1 mg via INTRAVENOUS
  Filled 2023-06-12: qty 1

## 2023-06-12 NOTE — ED Notes (Signed)
Removed patient from Bipap per physician. Patient placed on 15L [place] high flow cannula. Patient vitals are: HR 89/RR 28/Sats 93%. Patient resting comfortably. BBS slight scattered crackles. Physician notified of patients status.

## 2023-06-12 NOTE — ED Notes (Signed)
NTG stopped per [facility]

## 2023-06-12 NOTE — ED Provider Notes (Signed)
[facility] Provider Note   CSN: [id number] Arrival date & time: 06/12/23  1936     History  Chief Complaint  Patient presents with   Numbness   Aphasia    Jessica [name] is a 52 y.o. female with resistant HTN, HFpEF, obesity, IBS, whopresents with slurred speech, right arm/leg numbness that began 30 min PTA to ED, which has already begun to improve. No h/o similar. A/w mild headache. No weakness, falls/head trauma, neck pain, visual changes. Also endorses SOB that started around the same time without any chest pain, fevers chills. Does not take a blood thinner.  Triage RN noted her to be coughing intermittently. BP 266/171 on arrival to ED, satting 89% on RA.  Further history limited by acuity of situation.  HPI     Home Medications Prior to Admission medications   Medication Sig Start Date End Date Taking? Authorizing Provider  acetaminophen (TYLENOL) 325 MG tablet Take 2 tablets (650 mg total) by mouth every 6 (six) hours as needed. 10/22/22   Charlett Nose, MD  albuterol (VENTOLIN HFA) 108 (90 Base) MCG/ACT inhaler Inhale 1 puff into the lungs every 4 (four) hours as needed for shortness of breath or wheezing. 01/22/22   [provider]  atorvastatin (LIPITOR) 40 MG tablet Take 40 mg by mouth daily. 08/17/21   [provider]  carvedilol (COREG) 12.5 MG tablet TAKE 1 TABLET(12.5 MG) BY MOUTH TWICE DAILY WITH A MEAL Patient taking differently: Take 12.5 mg by mouth 2 (two) times daily with a meal. TAKE 1 TABLET(12.5 MG) BY MOUTH TWICE DAILY WITH A MEAL 02/09/22   Cantwell, Celeste C, PA-C  Cholecalciferol (VITAMIN D-3 PO) Take 1 capsule by mouth daily.    [provider]  ezetimibe (ZETIA) 10 MG tablet Take 1 tablet (10 mg total) by mouth daily. Patient not taking: Reported on 10/20/2022 03/18/22 07/16/22  Cantwell, Park Meo C, PA-C  ibuprofen (ADVIL) 200 MG tablet Take 3 tablets (600 mg total) by mouth every  6 (six) hours as needed. 10/22/22   Charlett Nose, MD  losartan (COZAAR) 50 MG tablet Take 50 mg by mouth at bedtime.    [provider]  spironolactone (ALDACTONE) 50 MG tablet TAKE 1 TABLET(50 MG) BY MOUTH DAILY Patient not taking: Reported on 10/20/2022 02/09/22   Cantwell, Celeste C, PA-C  tobramycin (TOBREX) 0.3 % ophthalmic solution Place 1 drop into both eyes every 4 (four) hours. 04/05/23   [provider]      Allergies    Amlodipine besylate, Aspirin, Penicillins, and Triamterene-hctz    Review of Systems   Review of Systems A 10 point review of systems was performed and is negative unless otherwise reported in HPI.  Physical Exam Updated Vital Signs BP (!) 174/109   Pulse 85   Temp 97.7 F (36.5 C)   Resp (!) 36   Ht 5\' 6"  (1.676 m)   Wt 124.7 kg   SpO2 92%   BMI 44.39 kg/m  Physical Exam General: Uncomfortable appearing female, lying in bed.  HEENT: PERRLA, EOMI, Sclera anicteric, MMM, trachea midline. Tongue protrudes midline. No tongue fasciculations.  Cardiology: Tachycardic regular rate, no murmurs/rubs/gallops. BL radial and DP pulses equal bilaterally.  Resp: Mildly increased RR and effort. Crackles bilaterally.   Abd: Soft, non-tender, non-distended. No rebound tenderness or guarding.  GU: Deferred. MSK: No peripheral edema or signs of trauma. Extremities without deformity or TTP. No cyanosis or clubbing. Skin: warm, dry.  Neuro: A&Ox4, CNs II-XII grossly intact. 5/5 strength all extremities. Sensation grossly intact. Normal speech.  Psych: Anxious mood and affect.   ED Results / Procedures / Treatments   Labs (all labs ordered are listed, but only abnormal results are displayed) Labs Reviewed  URINALYSIS, ROUTINE W REFLEX MICROSCOPIC - Abnormal; Notable for the following components:      Result Value   Hgb urine dipstick TRACE (*)    Protein, ur 100 (*)    All other components within normal limits  COMPREHENSIVE METABOLIC PANEL  - Abnormal; Notable for the following components:   Sodium 133 (*)    Glucose, Bld 109 (*)    Calcium 10.8 (*)    Total Protein 8.6 (*)    Total Bilirubin 1.6 (*)    All other components within normal limits  BRAIN NATRIURETIC PEPTIDE - Abnormal; Notable for the following components:   B Natriuretic Peptide 286.8 (*)    All other components within normal limits  URINALYSIS, MICROSCOPIC (REFLEX) - Abnormal; Notable for the following components:   Bacteria, UA MANY (*)    All other components within normal limits  I-STAT VENOUS BLOOD GAS, ED - Abnormal; Notable for the following components:   pCO2, Ven 40.6 (*)    pO2, Ven 50 (*)    Hemoglobin 15.6 (*)    All other components within normal limits  I-STAT VENOUS BLOOD GAS, ED - Abnormal; Notable for the following components:   pH, Ven 7.440 (*)    pCO2, Ven 34.7 (*)    pO2, Ven 63 (*)    All other components within normal limits  CBC  DIFFERENTIAL  ETHANOL  PREGNANCY, URINE  PROTIME-INR  APTT  CBG MONITORING, ED    EKG EKG Interpretation Date/Time:  Sunday June 12 2023 19:55:46 EDT Ventricular Rate:  105 PR Interval:  165 QRS Duration:  95 QT Interval:  377 QTC Calculation: 499 R Axis:   31  Text Interpretation: Sinus tachycardia LAE, consider biatrial enlargement Left ventricular hypertrophy Abnormal T, consider ischemia, lateral leads Baseline wander Confirmed by Vivi Barrack [phone]) on 06/12/2023 10:08:34 PM  Radiology DG Chest Portable 1 View  Result Date: 06/12/2023 CLINICAL DATA:  Shortness of breath EXAM: PORTABLE CHEST 1 VIEW COMPARISON:  Chest x-ray dated April 05, 2023 FINDINGS: Unchanged cardiomegaly. Diffuse mild heterogeneous airspace opacities, increased when compared with the prior exam. No evidence of pleural effusion or pneumothorax. IMPRESSION: Increased heterogeneous airspace opacities, likely due to pulmonary edema. Electronically Signed   By: Allegra Lai M.D.   On: 06/12/2023 20:40   CT HEAD WO  CONTRAST  Result Date: 06/12/2023 CLINICAL DATA:  Slurred speech and right arm numbness. EXAM: CT HEAD WITHOUT CONTRAST TECHNIQUE: Contiguous axial images were obtained from the base of the skull through the vertex without intravenous contrast. RADIATION DOSE REDUCTION: This exam was performed according to the departmental dose-optimization program which includes automated exposure control, adjustment of the mA and/or kV according to patient size and/or use of iterative reconstruction technique. COMPARISON:  Apr 23, 2021 FINDINGS: Brain: No evidence of acute infarction, hemorrhage, hydrocephalus, extra-axial collection or mass lesion/mass effect. There are areas of decreased attenuation within the white matter tracts of the supratentorial brain, consistent with microvascular disease changes. This is greater than expected for the patient's age. Small, chronic bilateral para thalamic lacunar infarcts are seen. Vascular: No hyperdense vessel or unexpected calcification. Skull: Normal. Negative for fracture or focal lesion. Sinuses/Orbits: No acute finding. Other: None. IMPRESSION: 1. No acute intracranial abnormality. 2.  Small, chronic bilateral para thalamic lacunar infarcts. 3. Microvascular disease changes, greater than expected for the patient's age. Electronically Signed   By: Aram Candela M.D.   On: 06/12/2023 20:37    Procedures .Critical Care  Performed by: Loetta Rough, MD Authorized by: Loetta Rough, MD   Critical care provider statement:    Critical care time (minutes):  90   Critical care was necessary to treat or prevent imminent or life-threatening deterioration of the following conditions:  Respiratory failure (hypertensive emergency)   Critical care was time spent personally by me on the following activities:  Development of treatment plan with patient or surrogate, discussions with consultants, evaluation of patient's response to treatment, examination of patient, ordering and  review of laboratory studies, ordering and review of radiographic studies, ordering and performing treatments and interventions, pulse oximetry, re-evaluation of patient's condition, review of old charts, ventilator management and obtaining history from patient or surrogate   Care discussed with: accepting provider at another facility       Medications Ordered in ED Medications  nitroGLYCERIN 50 mg in dextrose 5 % 250 mL (0.2 mg/mL) infusion (0 mcg/min Intravenous Stopped 06/12/23 2144)  hydrALAZINE (APRESOLINE) injection 5 mg (5 mg Intravenous Given 06/12/23 2045)  LORazepam (ATIVAN) injection 1 mg (1 mg Intravenous Given 06/12/23 2053)  furosemide (LASIX) injection 60 mg (60 mg Intravenous Given 06/12/23 2057)    ED Course/ Medical Decision Making/ A&P                          Medical Decision Making Amount and/or Complexity of Data Reviewed Labs: ordered. Decision-making details documented in ED Course. Radiology: ordered.  Risk Prescription drug management. Decision regarding hospitalization.    This patient presents to the ED for concern of neuro sxs, SOB, HTN; this involves an extensive number of treatment options, and is a complaint that carries with it a high risk of complications and morbidity.  I considered the following differential and admission for this acute, potentially life threatening condition.   MDM:    Patient's initial blood pressure 266/171.  This is extremely concerning for hypertensive emergency in the setting of shortness of breath and neurologic symptoms.  Must also consider ICH or CVA though patient's neurologic symptoms have already began to improve.  Consider TIA as well.  Given hydralazine 5 mg IV for hypotension initially.  Throughout her time in triage and while going to the CT scanner, patient began to complain of increasing shortness of breath, was coughing more, became hypoxic to 89% on room air.  Patient was placed in the CT scanner to clear from an ICH  perspective and while she was laying down in the scanner she began to complain of extreme shortness of breath.  She had been tripoding, severe respiratory distress, rhonchi and crackles bilaterally with decreased air movement, all indicating flash pulmonary edema.  Patient was moved to the recess bay and started on BiPAP, nitroglycerin, and Lasix immediately.  Required 1 mg IV Ativan as well for panic and in order to tolerate the BiPAP, which helped significantly.  Chest x-ray demonstrates pulmonary edema and I have low concern for other causes of shortness of breath given her clinical picture, including PE, pneumonia, PTX, obstructive lung disease.  Unclear cause of her hypertensive emergency at this time.  She states she has not missed any of her doses of her home medicines and has not had any changes to her regimen recently.  Will  obtain UDS as well to eval for sympathomimetic use.  Clinical Course as of 06/12/23 2235  Wynelle Link Jun 12, 2023  1956 Glucose-Capillary: 67 wnl [HN]  2013 Most recent BP 202/118 after hydralazine [HN]  2028 PT with worsening respiratory status, now on 6l [place] with increased WOB. Will be moved to resus to be place don BiPAP. Likely flash pulm edema. Will start nitroglycerin gtt and give lasix as well [HN]  2127 Pt looking improved after 1 mg IV ativan, initiation of BiPAP, nitroglycerin gtt, and lasix 60 mg IV. BP improving, due to concern for lowering BP too fast, lower limit for SBP on nitroglycerin gtt set at 185 mmHg [HN]  2140 BP dropped into 120s, stopping nitroglycerin gtt and will reassess [HN]  2156 BP without nitroglycerin now 160s systolic. D/w RN to restart if SBP >200 mmHg. Pt increased air movement ,now 600 cc Vt on BIPAP, had been 400 cc when initiated BiPAP. Repeat VBG pending.  [HN]  2206 I-Stat venous blood gas, ([facility], [facility], [facility])(!) Mildly decreased pCO2, but now patient's resp rate has decreased significantly from 40s to 20s-30s and she appears much more  comfortable. [HN]  2207 B Natriuretic Peptide(!): 286.8 Elevated, consider possible cardiogenic or heart failure component [HN]  2207 Reevaluated from neurologic standpoint, she has no FNDs and her numbness has not returned as well. [HN]  2208 EKG had demonstrated diffuse ST changes likely related to BP [HN]  2210 Secretary erroneously paged NSGY. Repaging ICU. [HN]  2230 D/w ICU Dr. Everardo All who requests we trial patient off of BiPAP and if okay can likely go to progressive bed. Pt now on 15L HFNC satting 92%, RR 30 breaths/min, feeling okay. BP 171/109 not on nitroglycerin gtt. Has diuresed approx 800cc. Will consult to hospitalist. [HN]    Clinical Course User Index [HN] Loetta Rough, MD    Labs: I Ordered, and personally interpreted labs.  The pertinent results include:  those listed above  Imaging Studies ordered: I ordered imaging studies including CTH, CXR I independently visualized and interpreted imaging. I agree with the radiologist interpretation  Additional history obtained from chart review, daughter at bedside.    Cardiac Monitoring: The patient was maintained on a cardiac monitor.  I personally viewed and interpreted the cardiac monitored which showed an underlying rhythm of: sinus tachycardia, then NSR  Reevaluation: After the interventions noted above, I reevaluated the patient and found that they have :improved  Social Determinants of Health: Patient lives independently   Disposition:  Admit to hospitalist Dr. Clarise Cruz morbidities that complicate the patient evaluation  Past Medical History:  Diagnosis Date   Aortic atherosclerosis (HCC)    per coronary CTA   Coronary artery disease    cardiologist--- dr Rosemary Holms;   per coronary CT w/ CTA 03-15-2022  mild nonob cad   Heart failure with preserved ejection fraction [facility])    cardiologist--- patwardhan;   per echo 07-22-2021  EF 57%, grade 3 diastolic dysfunction (progressed from grade 2), elevated LAP    Hyperlipidemia    Hypertension    followed by pcp  and cardiologist   OSA on CPAP    10-20-2022  per pt uses as needed,  stated average 2 times per week   (followed by dr Frances Furbish)   Wears glasses      Medicines Meds ordered this encounter  Medications   hydrALAZINE (APRESOLINE) injection 5 mg   nitroGLYCERIN 50 mg in dextrose 5 % 250 mL (0.2 mg/mL) infusion  LORazepam (ATIVAN) injection 1 mg   furosemide (LASIX) injection 60 mg    I have reviewed the patients home medicines and have made adjustments as needed  Problem List / ED Course: Problem List Items Addressed This Visit   None Visit Diagnoses     Flash pulmonary edema (HCC)    -  Primary   Hypertensive emergency       Relevant Medications   hydrALAZINE (APRESOLINE) injection 5 mg (Completed)   nitroGLYCERIN 50 mg in dextrose 5 % 250 mL (0.2 mg/mL) infusion   furosemide (LASIX) injection 60 mg (Completed)                   This note was created using dictation software, which may contain spelling or grammatical errors.    Loetta Rough, MD 06/12/23 [phone]

## 2023-06-12 NOTE — ED Notes (Signed)
EDP at bedside  

## 2023-06-12 NOTE — ED Triage Notes (Signed)
Slurred speech and numbness in right arm and right leg that started 30 minutes pta. Also endorses mild headache early today. Pt reports sx have "mostly" resolved at this time.  LKW 1900 today.  PMH hypertension - reports compliance with meds MD Jearld Fenton in triage to assess

## 2023-06-12 NOTE — ED Notes (Signed)
EDP notified of increase in BP, agreeable to restarting NTG gtt

## 2023-06-12 NOTE — ED Notes (Signed)
Pt expressed sudden onset nausea. 4mg  zofran ordered, administered. Pt expressed relief after administration. Emesis bag provided

## 2023-06-12 NOTE — ED Notes (Signed)
Placed patient on 3L [place] due to oxygen saturations dipping to 88%. BBS diminished, patient coughing. Patient oxygen saturations increased to 92%. Patient tolerated well.

## 2023-06-13 ENCOUNTER — Other Ambulatory Visit: Payer: Self-pay

## 2023-06-13 ENCOUNTER — Observation Stay (HOSPITAL_COMMUNITY): Payer: BC Managed Care – PPO

## 2023-06-13 ENCOUNTER — Encounter (HOSPITAL_COMMUNITY): Payer: Self-pay | Admitting: Internal Medicine

## 2023-06-13 DIAGNOSIS — I2510 Atherosclerotic heart disease of native coronary artery without angina pectoris: Secondary | ICD-10-CM | POA: Diagnosis not present

## 2023-06-13 DIAGNOSIS — I110 Hypertensive heart disease with heart failure: Secondary | ICD-10-CM | POA: Diagnosis not present

## 2023-06-13 DIAGNOSIS — I161 Hypertensive emergency: Secondary | ICD-10-CM | POA: Diagnosis not present

## 2023-06-13 DIAGNOSIS — I160 Hypertensive urgency: Secondary | ICD-10-CM

## 2023-06-13 DIAGNOSIS — Z79899 Other long term (current) drug therapy: Secondary | ICD-10-CM | POA: Diagnosis not present

## 2023-06-13 DIAGNOSIS — R519 Headache, unspecified: Secondary | ICD-10-CM | POA: Diagnosis present

## 2023-06-13 DIAGNOSIS — I5031 Acute diastolic (congestive) heart failure: Secondary | ICD-10-CM | POA: Diagnosis not present

## 2023-06-13 DIAGNOSIS — I169 Hypertensive crisis, unspecified: Secondary | ICD-10-CM | POA: Diagnosis not present

## 2023-06-13 DIAGNOSIS — G4733 Obstructive sleep apnea (adult) (pediatric): Secondary | ICD-10-CM

## 2023-06-13 DIAGNOSIS — Z6836 Body mass index (BMI) 36.0-36.9, adult: Secondary | ICD-10-CM | POA: Diagnosis not present

## 2023-06-13 LAB — HIV ANTIBODY (ROUTINE TESTING W REFLEX): HIV Screen 4th Generation wRfx: NONREACTIVE

## 2023-06-13 LAB — RAPID URINE DRUG SCREEN, HOSP PERFORMED
Amphetamines: NOT DETECTED
Barbiturates: NOT DETECTED
Benzodiazepines: NOT DETECTED
Cocaine: NOT DETECTED
Opiates: NOT DETECTED
Tetrahydrocannabinol: NOT DETECTED

## 2023-06-13 MED ORDER — HYDRALAZINE HCL 20 MG/ML IJ SOLN: 5.0000 mg | INTRAMUSCULAR | Status: DC | PRN

## 2023-06-13 MED ORDER — SIMVASTATIN 20 MG PO TABS: 20.0000 mg | ORAL_TABLET | Freq: Every evening | ORAL | 1 refills | Status: DC

## 2023-06-13 MED ORDER — ONDANSETRON HCL 4 MG PO TABS: 4.0000 mg | ORAL_TABLET | Freq: Four times a day (QID) | ORAL | Status: DC | PRN

## 2023-06-13 MED ORDER — ONDANSETRON HCL 4 MG/2ML IJ SOLN: 4.0000 mg | Freq: Four times a day (QID) | INTRAMUSCULAR | Status: DC | PRN

## 2023-06-13 MED ORDER — ACETAMINOPHEN 650 MG RE SUPP: 650.0000 mg | Freq: Four times a day (QID) | RECTAL | Status: DC | PRN

## 2023-06-13 MED ORDER — SPIRONOLACTONE 25 MG PO TABS
50.0000 mg | ORAL_TABLET | Freq: Two times a day (BID) | ORAL | Status: DC
  Administered 2023-06-13: 50 mg via ORAL
  Filled 2023-06-13: qty 2

## 2023-06-13 MED ORDER — SODIUM CHLORIDE 0.9% FLUSH
3.0000 mL | Freq: Two times a day (BID) | INTRAVENOUS | Status: DC
  Administered 2023-06-13: 3 mL via INTRAVENOUS

## 2023-06-13 MED ORDER — POLYETHYLENE GLYCOL 3350 17 G PO PACK: 17.0000 g | PACK | Freq: Every day | ORAL | Status: DC | PRN

## 2023-06-13 MED ORDER — BISACODYL 5 MG PO TBEC: 5.0000 mg | DELAYED_RELEASE_TABLET | Freq: Every day | ORAL | Status: DC | PRN

## 2023-06-13 MED ORDER — ENOXAPARIN SODIUM 40 MG/0.4ML IJ SOSY
40.0000 mg | PREFILLED_SYRINGE | INTRAMUSCULAR | Status: DC
  Administered 2023-06-13: 40 mg via SUBCUTANEOUS
  Filled 2023-06-13: qty 0.4

## 2023-06-13 MED ORDER — ALBUTEROL SULFATE (2.5 MG/3ML) 0.083% IN NEBU: 3.0000 mL | INHALATION_SOLUTION | RESPIRATORY_TRACT | Status: DC | PRN

## 2023-06-13 MED ORDER — LOSARTAN POTASSIUM 50 MG PO TABS: 50.0000 mg | ORAL_TABLET | Freq: Every day | ORAL | Status: DC

## 2023-06-13 MED ORDER — ACETAMINOPHEN 325 MG PO TABS: 650.0000 mg | ORAL_TABLET | Freq: Four times a day (QID) | ORAL | Status: DC | PRN

## 2023-06-13 MED ORDER — LOSARTAN POTASSIUM 50 MG PO TABS: 50.0000 mg | ORAL_TABLET | Freq: Every day | ORAL | 1 refills | Status: DC

## 2023-06-13 MED ORDER — CARVEDILOL 12.5 MG PO TABS: 12.5000 mg | ORAL_TABLET | Freq: Two times a day (BID) | ORAL | Status: DC

## 2023-06-13 MED ORDER — NITROGLYCERIN 2 % TD OINT
1.0000 [in_us] | TOPICAL_OINTMENT | Freq: Once | TRANSDERMAL | Status: AC
  Administered 2023-06-13: 1 [in_us] via TOPICAL
  Filled 2023-06-13: qty 1

## 2023-06-13 NOTE — ED Notes (Signed)
MD Wickline made aware of patients increasing BP. Patient denies chest pain, shob, dizziness, palpitations, and diaphoresis at this time. Patient states "I feel normal"

## 2023-06-13 NOTE — ED Notes (Signed)
[facility] called for transport. 

## 2023-06-13 NOTE — Progress Notes (Signed)
Heart Failure Navigator Progress Note  Assessed for Heart & Vascular TOC clinic readiness.  Patient does not meet criteria due to [facility] patient.   Navigator will sign off at this time.   Kairie Vangieson, BSN, RN Heart Failure Nurse Navigator Secure Chat Only   

## 2023-06-13 NOTE — Discharge Summary (Signed)
Discharge Summary  Jessica [name] ZDG:[id number] DOB: 1971-07-07  PCP: Fatima Sanger, FNP  Admit date: 06/12/2023 Discharge date: 06/13/2023   Time spent: 35 minutes  Admitted From: Home Disposition:  Home  Recommendations for Outpatient Follow-up:  Follow up with PCP in 1-2 weeks Follow up with cardiology as soon as available Monitor your blood pressure about once a day and record values to take to your PCP Losartan was added back to your medication list Take all medications as directed    Discharge Diagnoses:  Active Hospital Problems   Diagnosis Date Noted   Hypertensive urgency 06/12/2023   OSA on CPAP 06/13/2023   Heart failure with preserved ejection fraction (HCC) 07/16/2021   Class 2 obesity due to excess calories with body mass index (BMI) of 36.0 to 36.9 in adult 08/20/2013    Resolved Hospital Problems  No resolved problems to display.    Discharge Condition: Stable   CODE STATUS:  Full  Diet recommendation:  Heart healthy, 2 gram sodium  Vitals:   06/13/23 0620 06/13/23 0815  BP: (!) 183/104 (!) 143/85  Pulse: 89 84  Resp: 20 (!) 22  Temp: 98.4 F (36.9 C) 98.1 F (36.7 C)  SpO2: 92% 94%    History of present illness:  Jessica [name] is a 52 y.o. year old female with medical history significant for CAD, HTN, HLD, and OSA on prn CPAP who presented on 06/12/2023 with stroke-like symptoms and was found to have hypertensive urgency/crisis. Remaining hospital course addressed in problem based format below:   Hospital Course:   BP improved, MRI negative for acute stroke but does show progressive disease related to chronic uncontrolled HTN.  HTN Crisis -Patient presenting with severe HTN and transient neurologic symptoms concerning for hypertensive crisis -She did not have evidence of end organ failure but did require transient use of BIPAP -The patient received Lasix and hydralazine in the ER with subsequent significant decrease in BP without  apparent difficulty -Based on her BP control no longer in a dangerous range, will observe patient in progressive care -Troponin negative x 2 -Resume home carvedilol, losartan, spironolactone -Will add prn IV hydralazine    CAD/HFpEF -Coronary CTA at 91% for age/sex -Per last cardiology note, she has refused to up-titrate medical therapy -She will need cardiology f/u as an outpatient   HLD -Previously on simvastatin, but it is not clear if she is currently taking it -Resume if not currently taking   OSA -Uses CPAP prn - recommend at bedtime use   Obesity -Body mass index is 36.85 kg/m..  -Weight loss should be encouraged -Outpatient PCP/bariatric medicine/bariatric surgery f/u encouraged    Consultations: None  Procedures/Studies: MRI brain, head CT, CXR  Discharge Exam: BP (!) 143/85 (BP Location: Right Arm)   Pulse 84   Temp 98.1 F (36.7 C) (Oral)   Resp (!) 22   Ht 6' (1.829 m)   Wt 123.2 kg   SpO2 94%   BMI 36.85 kg/m     Discharge Instructions Follow up: Please make an appointment to see your primary physician for follow up within 7 days of hospital discharge.  At that appointment:  -We routinely change or add medications that can affect your baseline labs and fluid status; therefore, you may require repeat blood work or tests during your next visit with your PCP.  Your PCP may decide not to get them or may add new tests based on their clinical decision.  -Please get all medicines reviewed and  adjusted.  -Please request that your primary physician go over all hospital tests and procedure/radiological results at the follow up.  Please get all hospital records sent to your physician by signing a hospital release before you go home.  Activity: As tolerated with fall precautions; use walker/cane & assistance as needed.  Disposition: Home    Diet:   Heart Healthy.  For Heart failure patients - Check your weight at the same time daily.  If you gain over 2  pounds, develop leg swelling, or experience more shortness of breath/chest pain, call your Primary MD immediately. Follow cardiac low salt diet with no more than 1.5 liters/day of fluid.  For all patients - If you experience worsening of your admission symptoms or develop shortness of breath, life threatening emergency, suicidal or homicidal thoughts you must seek medical attention immediately by calling 911 or calling your MD immediately.  Read complete instructions along with all the possible side effects for all the medicines you take and that have been prescribed to you. Take any new medicines after you have completely understood and accept all the possible adverse reactions/side effects.   Do not drive, operate heavy machinery, perform activities at heights, swimming or participation in water activities or provide baby sitting services if your were admitted for syncope/seizures until you have seen by Primary MD/Neurologist and advised to do so.  Do not drive when taking pain medications.    Do not take more than prescribed pain, sleep and anxiety medications.  Special Instructions: If you have smoked or chewed Tobacco  in the last 2 yrs please stop smoking; also stop any regular Alcohol and/or any Recreational drug use including marijuana.  Wear Seat belts while driving.   Please note:  You were cared for by a hospitalist during your hospital stay. If you have any questions about your discharge medications or the care you received while you were in the hospital, you can call the unit and asked to speak with the hospitalist on call. Once you are discharged, your primary care physician will handle any further medical issues. Please note that NO REFILLS for any discharge medications will be authorized, as it is imperative that you return to your primary care physician (or establish a relationship with a primary care physician if you do not have one) for your aftercare needs so that they can  reassess your need for medications and monitor your lab values.  Discharge Instructions     Call MD for:  difficulty breathing, headache or visual disturbances   Complete by: As directed    Call MD for:  persistant dizziness or light-headedness   Complete by: As directed    Diet - low sodium heart healthy   Complete by: As directed    Discharge instructions   Complete by: As directed    1. Follow up with PCP in 1-2 weeks 2. Follow up with cardiology as soon as available 3. Monitor your blood pressure about once a day and record values to take to your PCP 4. Losartan was added back to your medication list 5. Take all medications as directed   Increase activity slowly   Complete by: As directed       Allergies as of 06/13/2023       Reactions   Asa [aspirin] Nausea And Vomiting   Dyazide [triamterene-hctz] Nausea Only   Norvasc [amlodipine Besylate] Swelling   Lower extremity swelling   Penicillins Hives        Medication List  TAKE these medications    albuterol 108 (90 Base) MCG/ACT inhaler Commonly known as: VENTOLIN HFA Inhale 1-2 puffs into the lungs as needed for shortness of breath or wheezing.   carvedilol 12.5 MG tablet Commonly known as: COREG TAKE 1 TABLET(12.5 MG) BY MOUTH TWICE DAILY WITH A MEAL What changed:  how much to take how to take this when to take this   losartan 50 MG tablet Commonly known as: COZAAR Take 1 tablet (50 mg total) by mouth daily. What changed: when to take this   OVER THE COUNTER MEDICATION Take 1 each by mouth daily. Beets chews.   PRENATAL PO Take 1 tablet by mouth daily.   simvastatin 20 MG tablet Commonly known as: Zocor Take 1 tablet (20 mg total) by mouth every evening.   spironolactone 50 MG tablet Commonly known as: ALDACTONE TAKE 1 TABLET(50 MG) BY MOUTH DAILY What changed:  how much to take how to take this when to take this additional instructions   Vitamin D (Ergocalciferol) 1.25 MG (50000 UNIT)  Caps capsule Commonly known as: DRISDOL Take 50,000 Units by mouth every Thursday.       Allergies  Allergen Reactions   Asa [Aspirin] Nausea And Vomiting   Dyazide [Triamterene-Hctz] Nausea Only   Norvasc [Amlodipine Besylate] Swelling    Lower extremity swelling   Penicillins Hives      The results of significant diagnostics from this hospitalization (including imaging, microbiology, ancillary and laboratory) are listed below for reference.    Significant Diagnostic Studies: MR BRAIN WO CONTRAST  Result Date: 06/13/2023 CLINICAL DATA:  52 year old female with slurred speech and right arm numbness. Evidence of small vessel disease on head CT yesterday. EXAM: MRI HEAD WITHOUT CONTRAST TECHNIQUE: Multiplanar, multiecho pulse sequences of the brain and surrounding structures were obtained without intravenous contrast. COMPARISON:  Head CT 06/12/2023.  Previous brain MRI 01/04/2014. FINDINGS: Brain: No restricted diffusion or evidence of acute infarction. However, there is advanced signal abnormality in the bilateral cerebral white matter, bilateral deep gray nuclei, brainstem, and also the deep cerebellar nuclei. This consists of confluent T2/FLAIR hyperintensity and has significantly advanced compared to the 2015 MRI. Furthermore, there are numerous chronic microhemorrhages scattered throughout the brain and cerebellum although most concentrated in the deep gray and deep cerebellar nuclei. Associated susceptibility artifact on DWI. But cerebral white matter volume is relatively maintained. No definite cortical encephalomalacia. No midline shift, mass effect, evidence of mass lesion, ventriculomegaly, extra-axial collection or acute intracranial hemorrhage. Cervicomedullary junction and pituitary are within normal limits. Vascular: Major intracranial vascular flow voids are stable since 2015. There is a degree of generalized intracranial artery tortuosity. Skull and upper cervical spine: Negative  visible cervical spine. Visualized bone marrow signal is within normal limits. Sinuses/Orbits: Negative. Other: Visible internal auditory structures appear normal. Negative visible scalp and face. IMPRESSION: 1. No acute intracranial abnormality. 2. Very Advanced chronic signal changes in the brain, including numerous chronic microhemorrhages. This is most compatible with chronic small vessel disease and has severely progressed since a 2015 MRI. Electronically Signed   By: Odessa Fleming M.D.   On: 06/13/2023 12:19   DG Chest Portable 1 View  Result Date: 06/12/2023 CLINICAL DATA:  Shortness of breath EXAM: PORTABLE CHEST 1 VIEW COMPARISON:  Chest x-ray dated April 05, 2023 FINDINGS: Unchanged cardiomegaly. Diffuse mild heterogeneous airspace opacities, increased when compared with the prior exam. No evidence of pleural effusion or pneumothorax. IMPRESSION: Increased heterogeneous airspace opacities, likely due to pulmonary edema. Electronically Signed  By: Allegra Lai M.D.   On: 06/12/2023 20:40   CT HEAD WO CONTRAST  Result Date: 06/12/2023 CLINICAL DATA:  Slurred speech and right arm numbness. EXAM: CT HEAD WITHOUT CONTRAST TECHNIQUE: Contiguous axial images were obtained from the base of the skull through the vertex without intravenous contrast. RADIATION DOSE REDUCTION: This exam was performed according to the departmental dose-optimization program which includes automated exposure control, adjustment of the mA and/or kV according to patient size and/or use of iterative reconstruction technique. COMPARISON:  Apr 23, 2021 FINDINGS: Brain: No evidence of acute infarction, hemorrhage, hydrocephalus, extra-axial collection or mass lesion/mass effect. There are areas of decreased attenuation within the white matter tracts of the supratentorial brain, consistent with microvascular disease changes. This is greater than expected for the patient's age. Small, chronic bilateral para thalamic lacunar infarcts are  seen. Vascular: No hyperdense vessel or unexpected calcification. Skull: Normal. Negative for fracture or focal lesion. Sinuses/Orbits: No acute finding. Other: None. IMPRESSION: 1. No acute intracranial abnormality. 2. Small, chronic bilateral para thalamic lacunar infarcts. 3. Microvascular disease changes, greater than expected for the patient's age. Electronically Signed   By: Aram Candela M.D.   On: 06/12/2023 20:37    Microbiology: No results found for this or any previous visit (from the past 240 hour(s)).   Labs: Basic Metabolic Panel: Recent Labs  Lab 06/12/23 2000 06/12/23 2011 06/12/23 2153  NA 133* 136 135  K 3.6 3.9 3.7  CL 99  --   --   CO2 24  --   --   GLUCOSE 109*  --   --   BUN 14  --   --   CREATININE 0.94  --   --   CALCIUM 10.8*  --   --    Liver Function Tests: Recent Labs  Lab 06/12/23 2000  AST 22  ALT 20  ALKPHOS 85  BILITOT 1.6*  PROT 8.6*  ALBUMIN 4.2   No results for input(s): "LIPASE", "AMYLASE" in the last 168 hours. No results for input(s): "AMMONIA" in the last 168 hours. CBC: Recent Labs  Lab 06/12/23 2000 06/12/23 2011 06/12/23 2153  WBC 9.4  --   --   NEUTROABS 5.4  --   --   HGB 14.8 15.6* 15.0  HCT 43.2 46.0 44.0  MCV 85.9  --   --   PLT 305  --   --    Cardiac Enzymes: No results for input(s): "CKTOTAL", "CKMB", "CKMBINDEX", "TROPONINI" in the last 168 hours. BNP: BNP (last 3 results) Recent Labs    06/12/23 2000  BNP 286.8*    ProBNP (last 3 results) No results for input(s): "PROBNP" in the last 8760 hours.  CBG: Recent Labs  Lab 06/12/23 1941  GLUCAP 86       Signed:  Jonah Blue, MD [facility] 06/13/2023, 1:06 PM

## 2023-06-13 NOTE — H&P (Signed)
History and Physical    Patient: Jessica [name]:[id number] DOB: 12/11/1970 DOA: 06/12/2023 DOS: the patient was seen and examined on 06/13/2023 PCP: Fatima Sanger, FNP  Patient coming from: Home - lives with daughter; NOK: Daughter, Destane Kahn, [phone]   Chief Complaint: Stroke-like symptoms  HPI: Jessica [name] is a 52 y.o. female with medical history significant of CAD, HTN, HLD, and OSA on prn CPAP presenting with stroke-like symptoms.  She reports that she noticed numbness on her R arm, headache.  She was trying to talk but the words weren't coming out right.  She sat down in the living room but her R face felt like it was moving and swelling.  This happened once before when she was at work but only lasted 5 minutes and resolved.  Symptoms have resolved this time, lasted less than 10 minutes.  She occasionally checks her BP occasionally and it is always high, 160/99 is normal.  She occasionally misses doses of her BP medication, maybe once or twice a week.  She feels fine now, thinks she can go home.  She is struggling with her weight and is open to medications and possibly surgery.    ER Course:  [facility] to [facility] transfer, per Dr. Julian Reil:  HTN urgency / emergency, symptoms highly suspicious for PRES + flash pulm edema on presentation with SBP 266.  Improved on NTG drip. Titrated off of NTG drip and BIPAP.      Review of Systems: As mentioned in the history of present illness. All other systems reviewed and are negative. Past Medical History:  Diagnosis Date   Aortic atherosclerosis (HCC)    per coronary CTA   Coronary artery disease    cardiologist--- dr Rosemary Holms;   per coronary CT w/ CTA 03-15-2022  mild nonob cad   Heart failure with preserved ejection fraction [facility])    cardiologist--- patwardhan;   per echo 07-22-2021  EF 57%, grade 3 diastolic dysfunction (progressed from grade 2), elevated LAP   Hyperlipidemia    Hypertension    followed by pcp  and  cardiologist   OSA on CPAP    10-20-2022  per pt uses as needed,  stated average 2 times per week   (followed by dr Frances Furbish)   Wears glasses    Past Surgical History:  Procedure Laterality Date   BUNIONECTOMY Left    06/ 2022 w/ hammertoe correction and 06/ 2023 left bunion both @ [facility]   DILITATION & CURRETTAGE/HYSTROSCOPY WITH NOVASURE ABLATION N/A 10/22/2022   Procedure: DILATATION & CURETTAGE/HYSTEROSCOPY WITH NOVASURE ABLATION;  Surgeon: Charlett Nose, MD;  Location: [facility];  Service: Gynecology;  Laterality: N/A;   WRIST GANGLION EXCISION Left 1993   Social History:  reports that she has never smoked. She has never used smokeless tobacco. She reports that she does not drink alcohol and does not use drugs.  Allergies  Allergen Reactions   Asa [Aspirin] Nausea And Vomiting   Dyazide [Triamterene-Hctz] Nausea Only   Norvasc [Amlodipine Besylate] Swelling    Lower extremity swelling   Penicillins Hives    Family History  Problem Relation Age of Onset   Hypertension Mother    Hypertension Father    Heart disease Sister    Colon cancer Brother     Prior to Admission medications   Medication Sig Start Date End Date Taking? Authorizing Provider  acetaminophen (TYLENOL) 325 MG tablet Take 2 tablets (650 mg total) by mouth every 6 (six) hours as needed.  10/22/22   Charlett Nose, MD  albuterol (VENTOLIN HFA) 108 (90 Base) MCG/ACT inhaler Inhale 1 puff into the lungs every 4 (four) hours as needed for shortness of breath or wheezing. 01/22/22   [provider]  atorvastatin (LIPITOR) 40 MG tablet Take 40 mg by mouth daily. 08/17/21   [provider]  carvedilol (COREG) 12.5 MG tablet TAKE 1 TABLET(12.5 MG) BY MOUTH TWICE DAILY WITH A MEAL Patient taking differently: Take 12.5 mg by mouth 2 (two) times daily with a meal. TAKE 1 TABLET(12.5 MG) BY MOUTH TWICE DAILY WITH A MEAL 02/09/22   Cantwell, Celeste C, PA-C  Cholecalciferol (VITAMIN D-3  PO) Take 1 capsule by mouth daily.    [provider]  ezetimibe (ZETIA) 10 MG tablet Take 1 tablet (10 mg total) by mouth daily. Patient not taking: Reported on 10/20/2022 03/18/22 07/16/22  Cantwell, Park Meo C, PA-C  ibuprofen (ADVIL) 200 MG tablet Take 3 tablets (600 mg total) by mouth every 6 (six) hours as needed. 10/22/22   Charlett Nose, MD  losartan (COZAAR) 50 MG tablet Take 50 mg by mouth at bedtime.    [provider]  spironolactone (ALDACTONE) 50 MG tablet TAKE 1 TABLET(50 MG) BY MOUTH DAILY Patient not taking: Reported on 10/20/2022 02/09/22   Cantwell, Celeste C, PA-C  tobramycin (TOBREX) 0.3 % ophthalmic solution Place 1 drop into both eyes every 4 (four) hours. 04/05/23   [provider]    Physical Exam: Vitals:   06/13/23 0445 06/13/23 0500 06/13/23 0620 06/13/23 0815  BP: (!) 161/98 (!) 162/99 (!) 183/104 (!) 143/85  Pulse: 85 84 89 84  Resp: (!) 22  20 (!) 22  Temp:   98.4 F (36.9 C) 98.1 F (36.7 C)  TempSrc:   Oral Oral  SpO2: 98% 100% 92% 94%  Weight:   123.2 kg   Height:   6' (1.829 m)    General:  Appears calm and comfortable and is in NAD Eyes:   EOMI, normal lids, iris ENT:  grossly normal hearing, lips & tongue, mmm Neck:  no LAD, masses or thyromegaly Cardiovascular:  RRR, no m/r/g. No LE edema.  Respiratory:   CTA bilaterally with no wheezes/rales/rhonchi.  Normal respiratory effort. Abdomen:  soft, NT, ND Skin:  no rash or induration seen on limited exam Musculoskeletal:  grossly normal tone BUE/BLE, good ROM, no bony abnormality Psychiatric:  grossly normal mood and affect, speech fluent and appropriate, AOx3 Neurologic:  CN 2-12 grossly intact, moves all extremities in coordinated fashion   Radiological Exams on Admission: Independently reviewed - see discussion in A/P where applicable  MR BRAIN WO CONTRAST  Result Date: 06/13/2023 CLINICAL DATA:  52 year old female with slurred speech and right arm numbness.  Evidence of small vessel disease on head CT yesterday. EXAM: MRI HEAD WITHOUT CONTRAST TECHNIQUE: Multiplanar, multiecho pulse sequences of the brain and surrounding structures were obtained without intravenous contrast. COMPARISON:  Head CT 06/12/2023.  Previous brain MRI 01/04/2014. FINDINGS: Brain: No restricted diffusion or evidence of acute infarction. However, there is advanced signal abnormality in the bilateral cerebral white matter, bilateral deep gray nuclei, brainstem, and also the deep cerebellar nuclei. This consists of confluent T2/FLAIR hyperintensity and has significantly advanced compared to the 2015 MRI. Furthermore, there are numerous chronic microhemorrhages scattered throughout the brain and cerebellum although most concentrated in the deep gray and deep cerebellar nuclei. Associated susceptibility artifact on DWI. But cerebral white matter volume is relatively maintained. No definite cortical encephalomalacia. No  midline shift, mass effect, evidence of mass lesion, ventriculomegaly, extra-axial collection or acute intracranial hemorrhage. Cervicomedullary junction and pituitary are within normal limits. Vascular: Major intracranial vascular flow voids are stable since 2015. There is a degree of generalized intracranial artery tortuosity. Skull and upper cervical spine: Negative visible cervical spine. Visualized bone marrow signal is within normal limits. Sinuses/Orbits: Negative. Other: Visible internal auditory structures appear normal. Negative visible scalp and face. IMPRESSION: 1. No acute intracranial abnormality. 2. Very Advanced chronic signal changes in the brain, including numerous chronic microhemorrhages. This is most compatible with chronic small vessel disease and has severely progressed since a 2015 MRI. Electronically Signed   By: Odessa Fleming M.D.   On: 06/13/2023 12:19   DG Chest Portable 1 View  Result Date: 06/12/2023 CLINICAL DATA:  Shortness of breath EXAM: PORTABLE CHEST 1  VIEW COMPARISON:  Chest x-ray dated April 05, 2023 FINDINGS: Unchanged cardiomegaly. Diffuse mild heterogeneous airspace opacities, increased when compared with the prior exam. No evidence of pleural effusion or pneumothorax. IMPRESSION: Increased heterogeneous airspace opacities, likely due to pulmonary edema. Electronically Signed   By: Allegra Lai M.D.   On: 06/12/2023 20:40   CT HEAD WO CONTRAST  Result Date: 06/12/2023 CLINICAL DATA:  Slurred speech and right arm numbness. EXAM: CT HEAD WITHOUT CONTRAST TECHNIQUE: Contiguous axial images were obtained from the base of the skull through the vertex without intravenous contrast. RADIATION DOSE REDUCTION: This exam was performed according to the departmental dose-optimization program which includes automated exposure control, adjustment of the mA and/or kV according to patient size and/or use of iterative reconstruction technique. COMPARISON:  Apr 23, 2021 FINDINGS: Brain: No evidence of acute infarction, hemorrhage, hydrocephalus, extra-axial collection or mass lesion/mass effect. There are areas of decreased attenuation within the white matter tracts of the supratentorial brain, consistent with microvascular disease changes. This is greater than expected for the patient's age. Small, chronic bilateral para thalamic lacunar infarcts are seen. Vascular: No hyperdense vessel or unexpected calcification. Skull: Normal. Negative for fracture or focal lesion. Sinuses/Orbits: No acute finding. Other: None. IMPRESSION: 1. No acute intracranial abnormality. 2. Small, chronic bilateral para thalamic lacunar infarcts. 3. Microvascular disease changes, greater than expected for the patient's age. Electronically Signed   By: Aram Candela M.D.   On: 06/12/2023 20:37    EKG: Independently reviewed.  Sinus tachycardia with rate 105; LVH; nonspecific ST changes with no evidence of acute ischemia   Labs on Admission: I have personally reviewed the available labs  and imaging studies at the time of the admission.  Pertinent labs:    VBG: 7.44/34.7/23.5 Na++ 133 Glucose 109 Bili 1.6 BNP 286.8 INR 0.9 UA: trace Hgb, 100 protein, many bacteria Upreg negative UDS negative   Assessment and Plan: Principal Problem:   Hypertensive urgency Active Problems:   Class 2 obesity due to excess calories with body mass index (BMI) of 36.0 to 36.9 in adult   Heart failure with preserved ejection fraction (HCC)   OSA on CPAP    HTN Crisis -Patient presenting with severe HTN and transient neurologic symptoms concerning for hypertensive crisis -She did not have evidence of end organ failure but did require transient use of BIPAP -The patient received Lasix and hydralazine in the ER with subsequent significant decrease in BP without apparent difficulty -Based on her BP control no longer in a dangerous range, will observe patient in progressive care -Troponin negative x 2 -Resume home carvedilol, losartan, spironolactone -Will add prn IV hydralazine   CAD/HFpEF -  Coronary CTA at 91% for age/sex -Per last cardiology note, she has refused to up-titrate medical therapy -She will need cardiology f/u as an outpatient  HLD -Previously on simvastatin, but it is not clear if she is currently taking it -Resume if not currently taking  OSA -Uses CPAP prn - recommend at bedtime use  Obesity -Body mass index is 36.85 kg/m..  -Weight loss should be encouraged -Outpatient PCP/bariatric medicine/bariatric surgery f/u encouraged     Advance Care Planning:   Code Status: Full Code - Code status was discussed with the patient and/or family at the time of admission.  The patient would want to receive full resuscitative measures at this time.   Consults: Nutrition  DVT Prophylaxis: Lovenox  Family Communication: None present  Severity of Illness: The appropriate patient status for this patient is OBSERVATION. Observation status is judged to be reasonable  and necessary in order to provide the required intensity of service to ensure the patient's safety. The patient's presenting symptoms, physical exam findings, and initial radiographic and laboratory data in the context of their medical condition is felt to place them at decreased risk for further clinical deterioration. Furthermore, it is anticipated that the patient will be medically stable for discharge from the hospital within 2 midnights of admission.   Author: Jonah Blue, MD 06/13/2023 12:48 PM  For on call review [email or web address].

## 2023-06-13 NOTE — ED Notes (Signed)
Decreased [facility] to 5L. Patient oxygen saturation 98%, RR 26. Will continue to monitor and make necessary changes as needed. Patient tolerating well.

## 2023-09-21 ENCOUNTER — Encounter (HOSPITAL_BASED_OUTPATIENT_CLINIC_OR_DEPARTMENT_OTHER): Payer: Self-pay

## 2023-09-21 ENCOUNTER — Ambulatory Visit (HOSPITAL_BASED_OUTPATIENT_CLINIC_OR_DEPARTMENT_OTHER): Payer: BC Managed Care – PPO | Admitting: Cardiovascular Disease

## 2023-09-21 ENCOUNTER — Encounter (HOSPITAL_BASED_OUTPATIENT_CLINIC_OR_DEPARTMENT_OTHER): Payer: Self-pay | Admitting: Cardiovascular Disease

## 2023-09-21 VITALS — BP 178/108 | HR 79 | Ht 65.0 in | Wt 268.2 lb

## 2023-09-21 DIAGNOSIS — R0609 Other forms of dyspnea: Secondary | ICD-10-CM

## 2023-09-21 DIAGNOSIS — I1A0 Resistant hypertension: Secondary | ICD-10-CM

## 2023-09-21 DIAGNOSIS — G4733 Obstructive sleep apnea (adult) (pediatric): Secondary | ICD-10-CM

## 2023-09-21 DIAGNOSIS — I10 Essential (primary) hypertension: Secondary | ICD-10-CM | POA: Diagnosis not present

## 2023-09-21 DIAGNOSIS — E7800 Pure hypercholesterolemia, unspecified: Secondary | ICD-10-CM

## 2023-09-21 DIAGNOSIS — Z006 Encounter for examination for normal comparison and control in clinical research program: Secondary | ICD-10-CM

## 2023-09-21 MED ORDER — VALSARTAN 320 MG PO TABS: 320.0000 mg | ORAL_TABLET | Freq: Every day | ORAL | 3 refills | Status: DC

## 2023-09-21 NOTE — Progress Notes (Signed)
Advanced Hypertension Clinic Initial Assessment:    Date:  09/26/2023   ID:  Jessica [name], DOB [birth year], MRN [id number]  PCP:  Jessica Sanger, FNP  Cardiologist:  None  Nephrologist:  Referring MD: Jessica Sanger, FNP   CC: Hypertension  History of Present Illness:    [name] is a 52 y.o. female with a hx of HFpEF, CAD, hypertension, pre-eclampsia, hyperlipidemia, morbid obesity, OSA on prn CPAP, here to establish care in the Advanced Hypertension Clinic. Previously followed by [name] with Dr. Rosemary [name] and last seen by Jessica So, PA-C 03/18/2022. At a prior visit she had EKG changes concerning for ischemia and had a coronary CTA revealing mild nonobstructive CAD with a coronary calcium score of 8.13. She was started on Zetia 10 mg daily. Blood pressures remained uncontrolled and she did not wish to add antihypertensive medications.  She presented to the ER on 06/12/2023 with complaints of slurred speech, right arm numbness/weakness, and found to be in hypertensive crisis to 266/71. MRI was negative for acute stroke but did show progressive disease related to chronic uncontrolled hypertension. Troponin x2 was negative. Blood pressures improved after receiving Lasix and hydralazine.  Seen by Jessica Chester, FNP 06/23/2023 and was referred to Advanced HTN clinic due to recent hypertensive crisis and noncompliance with meds. She was continued on a regimen of minoxidil 2.5 mg daily, carvedilol 12.5 mg BID, losartan 100 mg daily, spironolactone 50 mg daily.   Today, she reports struggling with hypertension for 23 years. She confirms a history of pre-eclampsia resulting in early delivery. In the office her blood pressure is elevated to 173/118 initially, and 178/108 on manual recheck. She notes that she took an Aleve on the way to the office today. At home her blood pressure has been averaging 120s/77-80s, sometimes with readings such as 167/74 and 161/97.  She works as a Runner, broadcasting/film/video in first grade, not much formal exercise. No recent issues with swelling, no chest pain or shortness of breath. Typically she prepares her meals at home and doesn't add any sodium. No coffee or sodas. For pain management she will usually take Aleve. Current supplements include vitamin D, and prenatal vitamins for hair. She confirms a diagnosis of sleep apnea and is using her CPAP as needed, which is about 3 nights a week on average. She denies any palpitations, chest pain, shortness of breath, peripheral edema, lightheadedness, headaches, syncope, orthopnea, or PND.  Previous antihypertensives: Amlodipine - swelling  Past Medical History:  Diagnosis Date   Aortic atherosclerosis (HCC)    per coronary CTA   Coronary artery disease    cardiologist--- dr Jessica [name];   per coronary CT w/ CTA 03-15-2022  mild nonob cad   Heart failure with preserved ejection fraction [facility])    cardiologist--- patwardhan;   per echo 07-22-2021  EF 57%, grade 3 diastolic dysfunction (progressed from grade 2), elevated LAP   Hyperlipidemia    Hypertension    followed by pcp  and cardiologist   OSA on CPAP    10-20-2022  per pt uses as needed,  stated average 2 times per week   (followed by dr Frances Furbish)   Pure hypercholesterolemia 09/26/2023   Wears glasses     Past Surgical History:  Procedure Laterality Date   BUNIONECTOMY Left    06/ 2022 w/ hammertoe correction and 06/ 2023 left bunion both @ [facility]   DILITATION & CURRETTAGE/HYSTROSCOPY WITH NOVASURE ABLATION N/A 10/22/2022   Procedure: DILATATION & CURETTAGE/HYSTEROSCOPY WITH NOVASURE ABLATION;  Surgeon: Jessica Nose, MD;  Location: [facility];  Service: Gynecology;  Laterality: N/A;   WRIST GANGLION EXCISION Left 1993    Current Medications: Current Meds  Medication Sig   albuterol (VENTOLIN HFA) 108 (90 Base) MCG/ACT inhaler Inhale 1-2 puffs into the lungs as needed for shortness of breath or wheezing.    carvedilol (COREG) 12.5 MG tablet TAKE 1 TABLET(12.5 MG) BY MOUTH TWICE DAILY WITH A MEAL (Patient taking differently: Take 12.5 mg by mouth 2 (two) times daily with a meal. TAKE 1 TABLET(12.5 MG) BY MOUTH TWICE DAILY WITH A MEAL)   minoxidil (LONITEN) 2.5 MG tablet Take 2.5 mg by mouth daily.   OVER THE COUNTER MEDICATION Take 1 each by mouth daily. Beets chews.   Prenatal Vit-Fe Fumarate-FA (PRENATAL PO) Take 1 tablet by mouth daily.   simvastatin (ZOCOR) 20 MG tablet Take 1 tablet (20 mg total) by mouth every evening.   spironolactone (ALDACTONE) 50 MG tablet TAKE 1 TABLET(50 MG) BY MOUTH DAILY (Patient taking differently: Take 50 mg by mouth 2 (two) times daily.)   valsartan (DIOVAN) 320 MG tablet Take 1 tablet (320 mg total) by mouth daily.   Vitamin D, Ergocalciferol, (DRISDOL) 1.25 MG (50000 UNIT) CAPS capsule Take 50,000 Units by mouth every Thursday.   [DISCONTINUED] losartan (COZAAR) 50 MG tablet Take 1 tablet (50 mg total) by mouth daily.     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  Tobacco Use   Smoking status: Never   Smokeless tobacco: Never  Vaping Use   Vaping status: Never Used  Substance and Sexual Activity   Alcohol use: Never   Drug use: Never   Sexual activity: Not Currently    Birth control/protection: None  Other Topics Concern   Not on file  Social History Narrative   Not on file   Social Determinants of Health   Financial Resource Strain: Not on file  Food Insecurity: Not on file  Transportation Needs: Not on file  Physical Activity: Not on file  Stress: Not on file  Social Connections: Not on file     Family History: The patient's family  history includes Colon cancer in her brother; Heart disease in her sister; Hypertension in her mother, sister, and sister; Stroke in her mother, sister, and sister.  ROS:   Please see the history of present illness. All other systems reviewed and are negative.  EKGs/Labs/Other Studies Reviewed:    Coronary CTA  03/15/2022: IMPRESSION: 1. Total coronary calcium score of 8.13. This was 91st percentile for age and sex matched control.   2. Normal coronary origin with left dominance.   3. Artifact: Moderate. (signal-to-noise (BMI 45)/ motion).   4. CAD-RADS = 2 Mild non-obstructive CAD.   Left main: Patent.   LAD: Patent.   LCX: Mild stenosis (25-49%) closer to 25% at mid LCx due to non calcified plaque.   RCA: Mild stenosis (25-49%) at proximal RCA due to eccentric noncalcified plaque.   5. Aortic atherosclerosis.   6. Main pulmonary artery mildly dilated without proximal filling defect.   RECOMMENDATIONS: Consider non-atherosclerotic causes of chest pain. Consider preventive therapy and risk factor modification.  EKG:  EKG is personally reviewed. 09/21/2023: Not ordered.  Recent Labs: 06/12/2023: ALT 20; B Natriuretic Peptide 286.8; BUN 14; Creatinine, Ser 0.94; Hemoglobin 15.0; Platelets 305; Potassium 3.7; Sodium 135   Recent Lipid Panel No results found for: "CHOL", "TRIG", "HDL", "CHOLHDL", "VLDL", "LDLCALC", "LDLDIRECT"  Physical Exam:    VS:  BP (!) 178/108 (BP Location: Left Arm, Patient Position: Sitting, Cuff Size: Large)   Pulse 79   Ht 5\' 5"  (1.651 m)   Wt 268 lb 3.2 oz (121.7 kg)   SpO2 93%   BMI 44.63 kg/m  , BMI Body mass index is 44.63 kg/m. GENERAL:  Well appearing HEENT: Pupils equal round and reactive, fundi not visualized, oral mucosa unremarkable NECK:  No jugular venous distention, waveform within normal limits, carotid upstroke brisk and symmetric, no bruits, no thyromegaly LUNGS:  Clear to auscultation bilaterally HEART:  RRR.  PMI not  displaced or sustained, S1 and S2 within normal limits, no S3, no S4, no clicks, no rubs, no murmurs ABD:  Flat, positive bowel sounds normal in frequency in pitch, no bruits, no rebound, no guarding, no midline pulsatile mass, no hepatomegaly, no splenomegaly EXT:  2 plus pulses throughout, no edema, no cyanosis, no clubbing SKIN:  No rashes, no nodules NEURO:  Cranial nerves II through XII grossly intact, motor grossly intact throughout PSYCH:  Cognitively intact, oriented to person place and time   ASSESSMENT/PLAN:    No problem-specific Assessment & Plan notes found for this encounter.  # Resistant HTN:  BP remains uncontrolled despite being on muliple medications.  We will stop losartan and start valsartan 320mg  daily.  Refer to the Right Start program and recommend at least 150 minutes of exercise weekly.  Encourage nightly CPAP use.  Evaluate for secodary causes as listed below.  She consents to monitoring in the [employer] system and to enrolling in our remote patient monitoring program.   Screening for Secondary Hypertension:     09/21/2023    3:18 PM  Causes  Drugs/Herbals Screened     - Comments no caffeine or EtOH.  Occasional NSAIDS  Renovascular HTN Screened     - Comments MRI normla in 2008  Sleep Apnea Screened     - Comments not using CPAP regularly  Thyroid Disease Screened     - Comments check TSH  Hyperaldosteronism Screened     - Comments check renin and aldosterone  Pheochromocytoma N/A  Cushing's Syndrome Screened  Hyperparathyroidism N/A  Coarctation of the Aorta Screened     - Comments BP symmetric  Compliance Screened    Relevant Labs/Studies:    Latest Ref Rng & Units 06/12/2023    9:53 PM 06/12/2023    8:11 PM 06/12/2023    8:00 PM  Basic Labs  Sodium 135 - 145 mmol/L 135  136  133   Potassium 3.5 - 5.1 mmol/L 3.7  3.9  3.6   Creatinine 0.44 - 1.00 mg/dL   1.30      Disposition:    FU with APP/PharmD in 1 month for the next 3 months.   FU with  Gerber Penza C. Duke Salvia, MD, [facility] in 4 months.  Medication Adjustments/Labs and Tests Ordered: Current medicines are reviewed at length with the patient today.  Concerns regarding medicines are outlined above.   Orders Placed This Encounter  Procedures   TSH   Aldosterone + renin activity w/ ratio   Basic metabolic panel   Cortisol   Cantril's Ladder Assessment   Meds ordered  this encounter  Medications   valsartan (DIOVAN) 320 MG tablet    Sig: Take 1 tablet (320 mg total) by mouth daily.    Dispense:  90 tablet    Refill:  3    D/C LOSARTAN   I,Mathew Stumpf,acting as a scribe for Chilton Si, MD.,have documented all relevant documentation on the behalf of Chilton Si, MD,as directed by  Chilton Si, MD while in the presence of Chilton Si, MD.  I, Jackelyne Sayer C. Duke Salvia, MD have reviewed all documentation for this visit.  The documentation of the exam, diagnosis, procedures, and orders on 09/26/2023 are all accurate and complete.   Signed, Chilton Si, MD  09/26/2023 3:15 PM    [facility]

## 2023-09-21 NOTE — Research (Signed)
  Subject Name: Jessica [name] met inclusion and exclusion criteria for the Virtual Care and Social Determinant Interventions for the management of hypertension trial.  The informed consent form, study requirements and expectations were reviewed with the subject by Dr. Duke Salvia and myself. The subject was given the opportunity to read the consent and ask questions. The subject verbalized understanding of the trial requirements.  All questions were addressed prior to the signing of the consent form. The subject agreed to participate in the trial and signed the informed consent. The informed consent was obtained prior to performance of any protocol-specific procedures for the subject.  A copy of the signed informed consent was given to the subject and a copy was placed in the subject's medical record.  Jessica [name] was randomized to Group 1.

## 2023-09-21 NOTE — Patient Instructions (Signed)
Medication Instructions:  STOP LOSARTAN   START VALSARTAN 320 MG DAILY    Labwork: BMET/RENIN/ALDOSTERONE/CORTISOL/TSH IN 1 WEEK    Testing/Procedures: NONE  Follow-Up: 1 MONTH WITH PHARM D    Special Instructions:  MONITOR YOUR BLOOD PRESSURE TWICE A DAY, LOG IN THE BOOK PROVIDED. BRING THE BOOK AND YOUR BLOOD PRESSURE MACHINE TO YOUR FOLLOW UP IN 1 MONTH   Right Start Program at [employer]  Sessions include Structured exercise sessions 2 group sessions per week for 9 weeks Monitored by fitness app 20 to 40-minute sessions Post program complications such as a neck step.  It is free for [facility] Members or $99 for non-members. Financial assistance is available.  No referral required.  Please call [phone], visit [facility] in person, or register online at [email or web address]

## 2023-09-22 ENCOUNTER — Telehealth (HOSPITAL_BASED_OUTPATIENT_CLINIC_OR_DEPARTMENT_OTHER): Payer: Self-pay | Admitting: Cardiovascular Disease

## 2023-09-22 NOTE — Telephone Encounter (Signed)
Call patient to get her scheduled for a follow up in a month to see PHARM D

## 2023-09-26 ENCOUNTER — Encounter (HOSPITAL_BASED_OUTPATIENT_CLINIC_OR_DEPARTMENT_OTHER): Payer: Self-pay | Admitting: Cardiovascular Disease

## 2023-09-26 ENCOUNTER — Telehealth: Payer: Self-pay | Admitting: Cardiovascular Disease

## 2023-09-26 DIAGNOSIS — E7800 Pure hypercholesterolemia, unspecified: Secondary | ICD-10-CM | POA: Insufficient documentation

## 2023-09-26 HISTORY — DX: Pure hypercholesterolemia, unspecified: E78.00

## 2023-09-26 NOTE — Telephone Encounter (Signed)
Pt c/o medication issue:  1. Name of Medication:  valsartan (DIOVAN) 320 MG tablet  2. How are you currently taking this medication (dosage and times per day)?  once daily at night   3. Are you having a reaction (difficulty breathing--STAT)?   4. What is your medication issue?   Patient states she thinks she started this medication on Thursday night and she has taken once daily as prescribed since then. This morning patient states that she feels "loopy." She states she is a little lightheaded and weak as well. She assumes the medication is causing these symptoms.

## 2023-09-26 NOTE — Telephone Encounter (Signed)
Returned call to patient,   Patient states she started taking it Thursday night, she woke up feeling weak and loopy.   Patient states she is at work right now and does not have her blood pressure logs, although it has still been high.   Advised patient that when she gets home to send in BP log and we will review them.

## 2023-09-27 ENCOUNTER — Encounter (HOSPITAL_BASED_OUTPATIENT_CLINIC_OR_DEPARTMENT_OTHER): Payer: Self-pay | Admitting: Cardiovascular Disease

## 2023-09-29 ENCOUNTER — Other Ambulatory Visit (HOSPITAL_BASED_OUTPATIENT_CLINIC_OR_DEPARTMENT_OTHER): Payer: Self-pay

## 2023-09-29 ENCOUNTER — Telehealth (HOSPITAL_BASED_OUTPATIENT_CLINIC_OR_DEPARTMENT_OTHER): Payer: Self-pay | Admitting: *Deleted

## 2023-09-29 LAB — BASIC METABOLIC PANEL
BUN/Creatinine Ratio: 11 (ref 9–23)
BUN: 11 mg/dL (ref 6–24)
CO2: 21 mmol/L (ref 20–29)
Calcium: 9.4 mg/dL (ref 8.7–10.2)
Chloride: 100 mmol/L (ref 96–106)
Creatinine, Ser: 0.96 mg/dL (ref 0.57–1.00)
Glucose: 80 mg/dL (ref 70–99)
Potassium: 4.3 mmol/L (ref 3.5–5.2)
Sodium: 137 mmol/L (ref 134–144)
eGFR: 72 mL/min/{1.73_m2} (ref >59)

## 2023-09-29 LAB — CORTISOL: Cortisol: 9.6 ug/dL (ref 6.2–19.4)

## 2023-09-29 LAB — ALDOSTERONE + RENIN ACTIVITY W/ RATIO
Aldos/Renin Ratio: 41.6 — ABNORMAL HIGH (ref 0.0–30.0)
Aldosterone: 7.7 ng/dL (ref 0.0–30.0)
Renin Activity, Plasma: 0.185 ng/mL/h (ref 0.167–5.380)

## 2023-09-29 LAB — TSH: TSH: 0.904 u[IU]/mL (ref 0.450–4.500)

## 2023-09-29 NOTE — Addendum Note (Signed)
Addended by: Regis Bill B on: 09/29/2023 05:55 PM   Modules accepted: Orders

## 2023-09-29 NOTE — Telephone Encounter (Addendum)
Patient came into office today to get another [employer] blood pressure machine secondary to one given at her visit is not working.  Used the [employer] to check blood pressure on upper arm, 173/113. Compared to automatic monitor used in study, using large cuff blood pressure 156/96.  Patient does have larger upper arm so tried using forearm to check blood pressure and blood pressure was 203/112 Discussed with Ronn Melena NP who recommended patient use both her home wrist cuff placing wrist over heart and the [facility] with knowing that the numbers are about 20 points higher Scheduled follow up with Pharm D and asked patient to bring all 3 monitors and readings to follow up  Patient verbalized understanding.   Did review medications and changed her cholesterol medications secondary to chart being incorrect

## 2023-10-21 ENCOUNTER — Ambulatory Visit
Payer: BC Managed Care – PPO | Attending: Internal Medicine | Admitting: Pharmacist Clinician (PhC)/ Clinical Pharmacy Specialist

## 2023-10-21 ENCOUNTER — Encounter: Payer: Self-pay | Admitting: Pharmacist Clinician (PhC)/ Clinical Pharmacy Specialist

## 2023-10-21 DIAGNOSIS — I10 Essential (primary) hypertension: Secondary | ICD-10-CM | POA: Diagnosis not present

## 2023-10-21 MED ORDER — MINOXIDIL 2.5 MG PO TABS: 2.5000 mg | ORAL_TABLET | Freq: Two times a day (BID) | ORAL | 3 refills | Status: DC

## 2023-10-21 NOTE — Assessment & Plan Note (Addendum)
Assessment: BP is uncontrolled in office BP 140/86 mmHg;  above the goal (<130/80). Home readings consistently 160-170/90's Tolerates current medications well without any side effects Denies SOB, palpitation, chest pain, headaches,or swelling Praised new gym routine - going to [employer] twice weekly  Plan:  Increase minoxidil to 2.5 mg twice daily Continue taking valsartan, carvedilol, and spironolactone Patient to keep record of BP readings with heart rate and report to [place] at the next visit Patient to follow up with PharmD in 1 month  Labs ordered today:  none

## 2023-10-21 NOTE — Patient Instructions (Signed)
Follow up appointment: Thursday Dec 19 at 2:30 pm  Take your BP meds as follows:  Increase minoxidil to 2.5 mg twice daily  Continue with all other medications  Check your blood pressure at home daily (if able) and keep record of the readings.  Hypertension "High blood pressure"  Hypertension is often called "The Silent Killer." It rarely causes symptoms until it is extremely  high or has done damage to other organs in the body. For this reason, you should have your  blood pressure checked regularly by your physician. We will check your blood pressure  every time you see a provider at one of our offices.   Your blood pressure reading consists of two numbers. Ideally, blood pressure should be  below 120/80. The first ("top") number is called the systolic pressure. It measures the  pressure in your arteries as your heart beats. The second ("bottom") number is called the diastolic pressure. It measures the pressure in your arteries as the heart relaxes between beats.  The benefits of getting your blood pressure under control are enormous. A 10-point  reduction in systolic blood pressure can reduce your risk of stroke by 27% and heart failure by 28%  Your blood pressure goal is < 130/80  To check your pressure at home you will need to:  1. Sit up in a chair, with feet flat on the floor and back supported. Do not cross your ankles or legs. 2. Rest your left arm so that the cuff is about heart level. If the cuff goes on your upper arm,  then just relax the arm on the table, arm of the chair or your lap. If you have a wrist cuff, we  suggest relaxing your wrist against your chest (think of it as Pledging the Flag with the  wrong arm).  3. Place the cuff snugly around your arm, about 1 inch above the crook of your elbow. The  cords should be inside the groove of your elbow.  4. Sit quietly, with the cuff in place, for about 5 minutes. After that 5 minutes press the power  button to  start a reading. 5. Do not talk or move while the reading is taking place.  6. Record your readings on a sheet of paper. Although most cuffs have a memory, it is often  easier to see a pattern developing when the numbers are all in front of you.  7. You can repeat the reading after 1-3 minutes if it is recommended  Make sure your bladder is empty and you have not had caffeine or tobacco within the last 30 min  Always bring your blood pressure log with you to your appointments. If you have not brought your monitor in to be double checked for accuracy, please bring it to your next appointment.  You can find a list of quality blood pressure cuffs at validatebp.org

## 2023-10-21 NOTE — Progress Notes (Signed)
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 female patient who was referred to the Advanced Hypertension Clinic by Fatima Sanger FNP.  Patient was also previously followed by [facility] and sees Dr. Rosemary Holms.  She saw Dr. Duke Salvia in October, at which time her pressure was 178/108.  She was agreeable to join our RPM research and was randomized to group 1.  Dr. Duke Salvia switched her losartan to valsartan 320 mg and encouraged that she use CPAP every night.  Labs for secondary hypertension - renin/aldosterone and cortisone were all WNL.    She is in the office today for her first follow up visit.   She notes that she recently increased the minoxidil from 1.25 to 2.5 mg once daily - it was prescribed by her dermatologist for hair loss.  She doesn't have home readings with her today, but reports that they are consistently in the 160/90 range.  She does not like the idea of having to take more medications.    Blood Pressure Goal:  130/80  Current Medications:  valsartan 320 mg every day, spironolactone 50 mg bid, carvedilol 12.5 mg bid, minoxidil 2.5 mg qd  Adherence Assessment  Do you ever forget to take your medication? [] Yes [x] No  Do you ever skip doses due  to side effects? [] Yes [x] No  Do you have trouble affording your medicines? [] Yes [x] No  Are you ever unable to pick up your medication due to transportation difficulties? [] Yes [x] No   Adherence strategy: pill minder  Previously tried:   triamterene/hctz - nausea; amlodipine - edema  Family Hx:   mother with htn and stroke, father  no heart issues -  ; has 9 sisters and 2 brothers - 2 sisters with htn, 2 others with stroke; 1 daughter now 65, no hypertension at this time  Social Hx:      Tobacco: no  Alcohol: no  Caffeine: no   Diet:   mostly home cooked meals, does not add salt; protein all but fish; , some vegetables usually frozen, no snacking, water mostly for drinks  Exercise: twice weekly at [facility] - just started last week  Home BP readings:    forgot readings, states consistent    Accessory Clinical Findings    Lab Results  Component Value Date   CREATININE 0.96 09/23/2023   BUN 11 09/23/2023   NA 137 09/23/2023   K 4.3 09/23/2023   CL 100 09/23/2023   CO2 21 09/23/2023   Lab Results  Component Value Date   ALT 20 06/12/2023   AST 22 06/12/2023   ALKPHOS 85 06/12/2023   BILITOT 1.6 (H) 06/12/2023   No results found for: "HGBA1C"  Screening for Secondary Hypertension:  09/21/2023    3:18 PM  Causes  Drugs/Herbals Screened     - Comments no caffeine or EtOH.  Occasional NSAIDS  Renovascular HTN Screened     - Comments MRI normla in 2008  Sleep Apnea Screened     - Comments not using CPAP regularly  Thyroid Disease Screened     - Comments check TSH  Hyperaldosteronism Screened     - Comments check renin and aldosterone  Pheochromocytoma N/A  Cushing's Syndrome Screened  Hyperparathyroidism N/A  Coarctation of the Aorta Screened     - Comments BP symmetric  Compliance Screened    Relevant Labs/Studies:    Latest Ref Rng & Units 09/23/2023   10:40 AM 06/12/2023    9:53 PM 06/12/2023    8:11 PM  Basic Labs  Sodium 134 - 144 mmol/L 137  135   136   Potassium 3.5 - 5.2 mmol/L 4.3  3.7  3.9   Creatinine 0.57 - 1.00 mg/dL 9.52          Latest Ref Rng & Units 09/23/2023   10:40 AM  Thyroid   TSH 0.450 - 4.500 uIU/mL 0.904        Latest Ref Rng & Units 09/23/2023   10:40 AM  Renin/Aldosterone   Aldosterone 0.0 - 30.0 ng/dL 7.7   Aldos/Renin Ratio 0.0 - 30.0 41.6              Home Medications    Current Outpatient Medications  Medication Sig Dispense Refill   minoxidil (LONITEN) 2.5 MG tablet Take 1 tablet (2.5 mg total) by mouth 2 (two) times daily. 60 tablet 3   albuterol (VENTOLIN HFA) 108 (90 Base) MCG/ACT inhaler Inhale 1-2 puffs into the lungs as needed for shortness of breath or wheezing.     atorvastatin (LIPITOR) 40 MG tablet Take 40 mg by mouth daily.     carvedilol (COREG) 12.5 MG tablet TAKE 1 TABLET(12.5 MG) BY MOUTH TWICE DAILY WITH A MEAL (Patient taking differently: Take 12.5 mg by mouth 2 (two) times daily with a meal. TAKE 1 TABLET(12.5 MG) BY MOUTH TWICE DAILY WITH A MEAL) 180 tablet 3   ezetimibe (ZETIA) 10 MG tablet Take 10 mg by mouth daily.     OVER THE COUNTER MEDICATION Take 1 each by mouth daily. Beets chews.     Prenatal Vit-Fe Fumarate-FA (PRENATAL PO) Take 1 tablet by mouth daily.     spironolactone (ALDACTONE) 50 MG tablet Take 50 mg by mouth 2 (two) times daily.     valsartan (DIOVAN) 320 MG tablet Take 1 tablet (320 mg total) by mouth daily. 90 tablet 3   Vitamin D, Ergocalciferol, (DRISDOL) 1.25 MG (50000 UNIT) CAPS capsule Take 50,000 Units by mouth every Thursday.     No current facility-administered medications for this visit.     Assessment & Plan   HYPERTENSION CONTROL Vitals:   10/21/23 0854 10/21/23 0901  BP: (!) 158/101 (!) 140/86    The patient's blood pressure is elevated above target today.  In order to address the patient's elevated BP: A current anti-hypertensive medication was adjusted today.; Blood pressure will be monitored at home to determine if medication  changes need to be made.      Essential hypertension Assessment: BP is uncontrolled in office BP 140/86 mmHg;  above the goal (<130/80). Home readings consistently 160-170/90's Tolerates current medications well without any side effects Denies SOB, palpitation, chest pain, headaches,or swelling Praised new gym routine - going to [employer] twice  weekly  Plan:  Increase minoxidil to 2.5 mg twice daily Continue taking valsartan, carvedilol, and spironolactone Patient to keep record of BP readings with heart rate and report to [place] at the next visit Patient to follow up with PharmD in 1 month  Labs ordered today:  none    Phillips Hay PharmD CPP [facility]  [address] [place], [place] [address] [phone]

## 2023-11-24 ENCOUNTER — Encounter: Payer: Self-pay | Admitting: Student

## 2023-11-24 ENCOUNTER — Encounter (HOSPITAL_BASED_OUTPATIENT_CLINIC_OR_DEPARTMENT_OTHER): Payer: Self-pay | Admitting: *Deleted

## 2023-11-24 ENCOUNTER — Ambulatory Visit: Payer: BC Managed Care – PPO | Attending: Internal Medicine | Admitting: Student

## 2023-11-24 VITALS — BP 156/92 | HR 78 | Wt 260.0 lb

## 2023-11-24 DIAGNOSIS — I10 Essential (primary) hypertension: Secondary | ICD-10-CM | POA: Diagnosis not present

## 2023-11-24 NOTE — Assessment & Plan Note (Signed)
Assessment: BP is uncontrolled in office BP 156/92 mmHg;  above the goal (<130/80). Home readings  recalls from memory 162/94, 153/93, 150/93, 182/97 Tolerates current medications well without any side effects Taking spironolactone 50 mg only once in the morning instead of taking it twice daily  Denies SOB, palpitation, chest pain, headaches,or swelling Praised new gym routine - going to [employer] twice weekly suggest to increase to 3-4 time per week  Discussed 1st line agents intolerances in detail ( nausea - HCTZ  and swelling/dizziness - amlodipine can't not remember the dose ) patient is in agreement to re-try amlodipine in future; leaving for vacation ( on cruise to [place]) tomorrow so does not want to change any medications    Plan:  Start taking spironolactone 50 mg twice daily as prescribed  Continue taking valsartan 320 mg every day, carvedilol 12.5 mg bid, minoxidil 2.5 mg twice daily Patient to keep record of BP readings with heart rate and report to [place] at the next visit Group 2 patient - will f/u with Dr.[place] in 2 months will rout this to [employer] ordered today:  none

## 2023-11-24 NOTE — Patient Instructions (Addendum)
Changes made by your pharmacist Carmela Hurt, PharmD at today's visit:    Instructions/Changes  (what do you need to do) Your Notes  (what you did and when you did it)  Start taking spironolactone 50 mg twice daily as prescribed    Continue taking valsartan 320 mg every day, carvedilol 12.5 mg bid, minoxidil 2.5 mg twice daily    Bring all of your meds, your BP cuff and your record of home blood pressures to your next appointment.    HOW TO TAKE YOUR BLOOD PRESSURE AT HOME  Rest 5 minutes before taking your blood pressure.  Don't smoke or drink caffeinated beverages for at least 30 minutes before. Take your blood pressure before (not after) you eat. Sit comfortably with your back supported and both feet on the floor (don't cross your legs). Elevate your arm to heart level on a table or a desk. Use the proper sized cuff. It should fit smoothly and snugly around your bare upper arm. There should be enough room to slip a fingertip under the cuff. The bottom edge of the cuff should be 1 inch above the crease of the elbow. Ideally, take 3 measurements at one sitting and record the average.  Important lifestyle changes to control high blood pressure  Intervention  Effect on the BP  Lose extra pounds and watch your waistline Weight loss is one of the most effective lifestyle changes for controlling blood pressure. If you're overweight or obese, losing even a small amount of weight can help reduce blood pressure. Blood pressure might go down by about 1 millimeter of mercury (mm Hg) with each kilogram (about 2.2 pounds) of weight lost.  Exercise regularly As a general goal, aim for at least 30 minutes of moderate physical activity every day. Regular physical activity can lower high blood pressure by about 5 to 8 mm Hg.  Eat a healthy diet Eating a diet rich in whole grains, fruits, vegetables, and low-fat dairy products and low in saturated fat and cholesterol. A healthy diet can lower high  blood pressure by up to 11 mm Hg.  Reduce salt (sodium) in your diet Even a small reduction of sodium in the diet can improve heart health and reduce high blood pressure by about 5 to 6 mm Hg.  Limit alcohol One drink equals 12 ounces of beer, 5 ounces of wine, or 1.5 ounces of 80-proof liquor.  Limiting alcohol to less than one drink a day for women or two drinks a day for men can help lower blood pressure by about 4 mm Hg.   If you have any questions or concerns please use My Chart to send questions or call the office at [phone]

## 2023-11-24 NOTE — Progress Notes (Signed)
Office Visit    Patient Name: Jessica [name] Date of Encounter: 11/24/2023  Primary Care Provider:  Fatima Sanger, FNP Primary Cardiologist:  None  Chief Complaint    Hypertension - Advanced hypertension clinic  Past Medical History   preeclampsia   CAD CAC = 8.13 - 91st percentile  HFpEF Grade 3 (restrictive), progressed from stage 2 in 2020; on valsartan, carvedilol, spironolactone  HLD 8/24 LDL 102, on atorvastatin 40, ezetimibe  OSA On prn CPAP  obesity Wt 121.7 kg, BMI 44.63    Allergies  Allergen Reactions   Asa [Aspirin] Nausea And Vomiting   Dyazide [Triamterene-Hctz] Nausea Only   Norvasc [Amlodipine Besylate] Swelling    Lower extremity swelling   Penicillins Hives    History of Present Illness    Jessica [name] is a 52 y.o. female patient who was referred to the Advanced Hypertension Clinic by Fatima Sanger FNP.  Patient was also previously followed by [facility] and sees Dr. Rosemary Holms.  She saw Dr. Duke Salvia in October, at which time her pressure was 178/108.  She was agreeable to join our RPM research and was randomized to group 1.  Dr. Duke Salvia switched her losartan to valsartan 320 mg and encouraged that she use CPAP every night.  Labs for secondary hypertension - renin/aldosterone and cortisone were all WNL.    She is in the office today for her first follow up visit.  Reports her BP did not changed much from increased dose minoxidil 2.5 mg daily to twice daily not her hair problem has improved. Reports she takes spironolactone 50 mg only in the morning she misunderstood the directions. Did not bring home BP log recalls from memory some of the BP readings ( 162/94, 153/93, 150/93, 182/97) she says she watches salt intake and go to gym twice a week. Encourage her to go to gym 3-4 times per week, pt reports she does not have time to do that.  Today we discussed 1st line BP lowering agent that are listed in intolerance. Amlodipine - swelling  and hydrochlorothiazide- nausea. Educated patient that these are common side effects specially amlodipine cause swelling at higher dose then pt reports she does not recall what dose she was on but it was causing her dizziness. Patient will think to try one of the 1st line agent at the next visit. She is leaving for [employer] and does not want to make any changes to the medications. However she is in agreement to start taking spironolactone 50 mg twice daily instead of once daily.     Blood Pressure Goal:  130/80   Current Medications:  valsartan 320 mg every day, spironolactone 50 mg daily, carvedilol 12.5 mg twice daily  minoxidil 2.5 mg twice daily   Adherence Assessment  Do you ever forget to take your medication? [] Yes [x] No  Do you ever skip doses due to side effects? [] Yes [x] No  Do you have trouble affording your medicines? [] Yes [x] No  Are you ever unable to pick up your medication due to transportation difficulties? [] Yes [x] No   Adherence strategy: pill minder  Previously tried:   triamterene/hctz - nausea; amlodipine - edema  Family Hx:   mother with htn and stroke, father  no heart issues -  ; has 9 sisters and 2 brothers - 2 sisters with htn, 2 others with stroke; 1 daughter now 33, no hypertension at this time  Social Hx:      Tobacco: no  Alcohol: no  Caffeine: no   Diet:   mostly home cooked meals, does not add salt; protein all but fish; , some vegetables usually frozen, no snacking, water mostly for drinks  Exercise: twice weekly at [facility] - just started last week  Home BP readings:  recalls from memory 162/94, 153/93, 150/93, 182/97   Accessory Clinical Findings    Lab Results  Component Value Date   CREATININE 0.96 09/23/2023   BUN 11 09/23/2023   NA 137 09/23/2023   K 4.3 09/23/2023   CL 100 09/23/2023   CO2 21 09/23/2023   Lab Results  Component Value Date   ALT 20 06/12/2023   AST 22 06/12/2023   ALKPHOS 85 06/12/2023    BILITOT 1.6 (H) 06/12/2023   No results found for: "HGBA1C"  Screening for Secondary Hypertension:      09/21/2023    3:18 PM  Causes  Drugs/Herbals Screened     - Comments no caffeine or EtOH.  Occasional NSAIDS  Renovascular HTN Screened     - Comments MRI normla in 2008  Sleep Apnea Screened     - Comments not using CPAP regularly  Thyroid Disease Screened     - Comments check TSH  Hyperaldosteronism Screened     - Comments check renin and aldosterone  Pheochromocytoma N/A  Cushing's Syndrome Screened  Hyperparathyroidism N/A  Coarctation of the Aorta Screened     - Comments BP symmetric  Compliance Screened    Relevant Labs/Studies:    Latest Ref Rng & Units 09/23/2023   10:40 AM 06/12/2023    9:53 PM 06/12/2023    8:11 PM  Basic Labs  Sodium 134 - 144 mmol/L 137  135  136   Potassium 3.5 - 5.2 mmol/L 4.3  3.7  3.9   Creatinine 0.57 - 1.00 mg/dL 5.40          Latest Ref Rng & Units 09/23/2023   10:40 AM  Thyroid   TSH 0.450 - 4.500 uIU/mL 0.904        Latest Ref Rng & Units 09/23/2023   10:40 AM  Renin/Aldosterone   Aldosterone 0.0 - 30.0 ng/dL 7.7   Aldos/Renin Ratio 0.0 - 30.0 41.6              Home Medications    Current Outpatient Medications  Medication Sig Dispense Refill   albuterol (VENTOLIN HFA) 108 (90 Base) MCG/ACT inhaler Inhale 1-2 puffs into the lungs as needed for shortness of breath or wheezing.     atorvastatin (LIPITOR) 40 MG tablet Take 40 mg by mouth daily.     carvedilol (COREG) 12.5 MG tablet TAKE 1 TABLET(12.5 MG) BY MOUTH TWICE DAILY WITH A MEAL (Patient taking differently: Take 12.5 mg by mouth 2 (two) times daily with a meal. TAKE 1 TABLET(12.5 MG) BY MOUTH TWICE DAILY WITH A MEAL) 180 tablet 3   ezetimibe (ZETIA) 10 MG tablet Take 10 mg by mouth daily.     minoxidil (LONITEN) 2.5 MG tablet Take 1 tablet (2.5 mg total) by mouth 2 (two) times daily. 60 tablet 3   OVER THE COUNTER MEDICATION Take 1 each by mouth daily. Beets chews.      Prenatal Vit-Fe Fumarate-FA (PRENATAL PO) Take 1 tablet by mouth daily.     spironolactone (ALDACTONE) 50 MG tablet Take 50 mg by mouth 2 (two) times daily.     valsartan (DIOVAN) 320 MG tablet Take 1 tablet (320 mg total) by mouth daily. 90 tablet 3   Vitamin D,  Ergocalciferol, (DRISDOL) 1.25 MG (50000 UNIT) CAPS capsule Take 50,000 Units by mouth every Thursday.     No current facility-administered medications for this visit.     Assessment & Plan   HYPERTENSION CONTROL Vitals:   11/24/23 1450 11/24/23 1459 11/24/23 1502  BP: (!) 164/97 (!) 161/98 (!) 156/92    The patient's blood pressure is elevated above target today.  In order to address the patient's elevated BP:       Essential hypertension Assessment: BP is uncontrolled in office BP 156/92 mmHg;  above the goal (<130/80). Home readings  recalls from memory 162/94, 153/93, 150/93, 182/97 Tolerates current medications well without any side effects Taking spironolactone 50 mg only once in the morning instead of taking it twice daily  Denies SOB, palpitation, chest pain, headaches,or swelling Praised new gym routine - going to [employer] twice weekly suggest to increase to 3-4 time per week  Discussed 1st line agents intolerances in detail ( nausea - HCTZ  and swelling/dizziness - amlodipine can't not remember the dose ) patient is in agreement to re-try amlodipine in future; leaving for vacation ( on cruise to [place]) tomorrow so does not want to change any medications    Plan:  Start taking spironolactone 50 mg twice daily as prescribed  Continue taking valsartan 320 mg every day, carvedilol 12.5 mg bid, minoxidil 2.5 mg twice daily Patient to keep record of BP readings with heart rate and report to [place] at the next visit Group 2 patient - will f/u with Dr.[place] in 2 months will rout this to [employer] ordered today:  none    Carmela Hurt, [address]D [facility] A Division of [facility] [address] [place], [place] [address]  Phone: [phone]; Fax: [phone]

## 2023-12-13 ENCOUNTER — Telehealth: Payer: Self-pay | Admitting: Neurology

## 2023-12-13 NOTE — Telephone Encounter (Signed)
 Pt states she would like a full mask for her cpap due to the nasal mask being uncomfortable. Requesting call back

## 2023-12-14 NOTE — Telephone Encounter (Signed)
 We haven't seen the patient since 2020. Please contact her and let her know that since its been over 3 years since we have seen her, she will need to have a referral sent here by her primary care to get re-established as a patient. We cannot give any prescriptions unless we've seen patients within the previous year.

## 2023-12-15 NOTE — Telephone Encounter (Addendum)
 LVM advising we would need a new referral sent from PCP to get her re-established as a pt before we can send any new orders

## 2023-12-20 NOTE — Telephone Encounter (Signed)
 Pt called to check on referral to be able to get a mask for her CPAP.

## 2023-12-26 NOTE — Telephone Encounter (Addendum)
Pt called to check on referral to schedule an appointment to be able to get a full face mask for CPAP. [facility] said they sent a referral  10 days ago through [employer] and it showing pending. Would like a call back. Transferred patient to New Patient Referrals

## 2023-12-26 NOTE — Telephone Encounter (Signed)
Returned patient call to schedule sleep consult, no answer. LVM. Insurance on file and in referral [facility]) is showing inactive. Requested patient call back or send MyChart msg with updated insurance before we can schedule consult with Dr. Frances Furbish.

## 2024-01-10 ENCOUNTER — Ambulatory Visit: Payer: 59 | Admitting: Neurology

## 2024-01-10 ENCOUNTER — Encounter: Payer: Self-pay | Admitting: Neurology

## 2024-01-10 VITALS — BP 137/78 | HR 84 | Ht 66.0 in | Wt 253.0 lb

## 2024-01-10 DIAGNOSIS — G4733 Obstructive sleep apnea (adult) (pediatric): Secondary | ICD-10-CM | POA: Diagnosis not present

## 2024-01-10 NOTE — Progress Notes (Signed)
 Subjective:    Patient ID: Jessica [name] is a 53 y.o. female.  HPI    True Mar, MD, PhD [facility] [address] [address] [place], [place] [address]  Dear Ronal,   I saw your patient, Jessica [name], upon your kind request in my neurologic clinic today for re-evaluation of her OSA. The patient is unaccompanied today and presents after a long gap of over 4 years. As you know, the patient is a 53 year old right-handed woman with an underlying medical history of congestive heart failure, hypertension, hyperlipidemia, vitamin D deficiency and morbid obesity with a BMI of over 40, who was diagnosed with obstructive sleep apnea in 2020.  I first met her on 11/27/2019 at the request of her cardiologist, Dr. Elmira, at which time she reported snoring and difficult to control blood pressure.  She was on 3 blood pressure medications.  She was advised to proceed with a sleep study.  She had a baseline sleep study on 12/27/2019 which indicated mild obstructive sleep apnea with an AHI of 10.2/h. During REM sleep she had more significant findings with a REM AHI of 30/h, overall oxygen saturation at baseline was 94%, nadir was 82%.  She had an increased percentage of stage II sleep, and reduced percentage of REM sleep.  She was advised to start AutoPap therapy.  Her set up date was 02/12/2020.  She did not return for follow-up appointments. She had admission to the hospital in July 2024 for acute pulmonary edema.  I reviewed her AutoPap compliance data from the past 30 in the past 90 days.  She is suboptimal with her compliance, average usage of less than 3 hours, AHI around 2/h, leak acceptable, 95th percentile of pressure around 11 cm with a range of 7 to 13 cm.  She is struggling to keep the nasal pillows in place, she would like to try a fullface mask.  She is followed by cardiology, Dr. Annabella Scarce.  She reports needing new supplies.  Her Epworth sleepiness  score is 3 out of 24, fatigue severity score is 16 out of 63.  Her bedtime is between 9 and 10 PM, rise time around 5:30 AM.  She is on 4 different blood pressure medications.  He does not drink caffeine daily.  She does not consume alcohol, she is a non-smoker.  She has nocturia about once per average night, denies recurrent morning headaches.  She is working on weight loss and has lost weight since we first met.  I reviewed your office note from 10/26/2023.  Previously:   11/26/20: (She) reports snoring and excessive daytime somnolence.  She has had difficult to control blood pressure.  She had a recent increase in her BiDil . I reviewed your office note from 11/26/2019.  She has had difficult to control blood pressure despite being on several medications.  Her Epworth sleepiness score is 3 out of 24, fatigue severity score is 33 out of 63.  Her blood pressure is elevated today.  She reports that she has not taken her morning blood pressure meds yet.  She denies any chest pain, shortness of breath, blurry vision or headache.  She sometimes wakes up with a headache which is dull, achy, bifrontal and brief.  She does not have a history of migraines.  She has nocturia about once per average night.  She is not aware of any family history of OSA.  She lives with her 41 year old daughter, her daughter has reported her snoring  to her.  She denies any symptoms of gasping for air but has woken herself up with a snort or her own snoring.  She is a non-smoker, does not currently utilize any alcohol, does not drink caffeine daily.  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  Bedtime is generally between 9 and 10 and rise time around she has no pets in the household.  She has a TV in the bedroom but does not actually use it.   Her Past Medical History Is Significant For: Past Medical History:  Diagnosis Date   Aortic atherosclerosis (HCC)    per coronary CTA   Coronary artery disease    cardiologist--- dr elmira;    per coronary CT w/ CTA 03-15-2022  mild nonob cad   Heart failure with preserved ejection fraction [facility])    cardiologist--- patwardhan;   per echo 07-22-2021  EF 57%, grade 3 diastolic dysfunction (progressed from grade 2), elevated LAP   Hyperlipidemia    Hypertension    followed by pcp  and cardiologist   OSA on CPAP    10-20-2022  per pt uses as needed,  stated average 2 times per week   (followed by dr buck)   Pure hypercholesterolemia 09/26/2023   Wears glasses     Her Past Surgical History Is Significant For: Past Surgical History:  Procedure Laterality Date   BUNIONECTOMY Left    06/ 2022 w/ hammertoe correction and 06/ 2023 left bunion both @ [facility]   DILITATION & CURRETTAGE/HYSTROSCOPY WITH NOVASURE ABLATION N/A 10/22/2022   Procedure: DILATATION & CURETTAGE/HYSTEROSCOPY WITH NOVASURE ABLATION;  Surgeon: Diedre Rosaline BRAVO, MD;  Location: [facility];  Service: Gynecology;  Laterality: N/A;   WRIST GANGLION EXCISION Left 1993    Her Family History Is Significant For: Family History  Problem Relation Age of Onset   Hypertension Mother    Stroke Mother    Hypertension Sister    Heart disease Sister    Hypertension Sister    Stroke Sister    Stroke Sister    Colon cancer Brother     Her Social History Is Significant For: Social History   Socioeconomic History   Marital status: Single    Spouse name: Not on file   Number of children: 1   Years of education: Not on file   Highest education level: Not on file  Occupational History   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  Tobacco Use   Smoking status: Never   Smokeless tobacco: Never  Vaping Use   Vaping status: Never Used  Substance and Sexual Activity   Alcohol use: Never   Drug use: Never   Sexual activity: Not Currently    Birth control/protection: None  Other Topics Concern   Not on file  Social History Narrative   Not on file   Social Drivers of Health   Financial Resource Strain: Not on  file  Food Insecurity: Not on file  Transportation Needs: Not on file  Physical Activity: Not on file  Stress: Not on file  Social Connections: Not on file    Her Allergies Are:  Allergies  Allergen Reactions   Asa [Aspirin] Nausea And Vomiting   Dyazide [Triamterene-Hctz] Nausea Only   Norvasc [Amlodipine Besylate] Swelling    Lower extremity swelling   Penicillins Hives  :   Her Current Medications Are:  Outpatient Encounter Medications as of 01/10/2024  Medication Sig   albuterol  (VENTOLIN  HFA) 108 (90 Base) MCG/ACT inhaler Inhale 1-2 puffs into the lungs  as needed for shortness of breath or wheezing.   atorvastatin  (LIPITOR) 40 MG tablet Take 40 mg by mouth daily.   carvedilol  (COREG ) 12.5 MG tablet TAKE 1 TABLET(12.5 MG) BY MOUTH TWICE DAILY WITH A MEAL (Patient taking differently: Take 12.5 mg by mouth 2 (two) times daily with a meal. TAKE 1 TABLET(12.5 MG) BY MOUTH TWICE DAILY WITH A MEAL)   minoxidil  (LONITEN ) 2.5 MG tablet Take 1 tablet (2.5 mg total) by mouth 2 (two) times daily.   OVER THE COUNTER MEDICATION Take 1 each by mouth daily. Beets chews.   Prenatal Vit-Fe Fumarate-FA (PRENATAL PO) Take 1 tablet by mouth daily.   spironolactone  (ALDACTONE ) 50 MG tablet Take 50 mg by mouth 2 (two) times daily.   valsartan  (DIOVAN ) 320 MG tablet Take 1 tablet (320 mg total) by mouth daily.   Vitamin D, Ergocalciferol, (DRISDOL) 1.25 MG (50000 UNIT) CAPS capsule Take 50,000 Units by mouth every Thursday.   ezetimibe  (ZETIA ) 10 MG tablet Take 10 mg by mouth daily. (Patient not taking: Reported on 01/10/2024)   No facility-administered encounter medications on file as of 01/10/2024.  :   Review of Systems:  Out of a complete 14 point review of systems, all are reviewed and negative with the exception of these symptoms as listed below:  Review of Systems  Neurological:        Patient in room #5 and alone. Patient states she here today for new mask for her CPAP machine. Patient states  she is well and stable.    Objective:  Neurological Exam  Physical Exam Physical Examination:   Vitals:   01/10/24 1520  BP: 137/78  Pulse: 84    General Examination: The patient is a very pleasant 53 y.o. female in no acute distress. She appears well-developed and well-nourished and well groomed.   HEENT: Normocephalic, atraumatic, pupils are equal, round and reactive to light, extraocular tracking is well-preserved, face is symmetric with normal facial animation.  Speech is clear, without dysarthria, hypophonia or voice tremor.  Airway examination reveals mild mouth dryness and moderate airway crowding.     Chest: Clear to auscultation without wheezing, rhonchi or crackles noted.   Heart: S1+S2+0, regular and normal without murmurs, rubs or gallops noted.    Abdomen: Soft, non-tender and non-distended with normal bowel sounds appreciated on auscultation.   Extremities: There is trace pitting edema in the distal lower extremities bilaterally.  She is wearing bilateral compression stockings, knee-highs.   Skin: Warm and dry without trophic changes noted.    Musculoskeletal: exam reveals no obvious joint deformities, tenderness or joint swelling or erythema.    Neurologically:  Mental status: The patient is awake, alert and oriented in all 4 spheres. Her immediate and remote memory, attention, language skills and fund of knowledge are appropriate. There is no evidence of aphasia, agnosia, apraxia or anomia. Speech is clear with normal prosody and enunciation. Thought process is linear. Mood is normal and affect is normal.  Cranial nerves II - XII are as described above under HEENT exam.  Motor exam: Normal bulk, strength and tone is noted. There is no resting or action tremor.  Fine motor skills and coordination: grossly intact.  Cerebellar testing: No dysmetria or intention tremor. There is no truncal or gait ataxia.  Sensory exam: intact to light touch in the upper and lower  extremities.  Gait, station and balance: She stands easily. No veering to one side is noted. No leaning to one side is noted. Posture is  age-appropriate and stance is narrow based. Gait shows normal stride length and normal pace. No problems turning are noted.                Assessment and Plan:    In summary, Jessica [name] is a 53 year old right-handed woman with an underlying medical history of congestive heart failure, hypertension, hyperlipidemia, vitamin D deficiency and morbid obesity with a BMI of over 40, who presents for reevaluation of her obstructive sleep apnea.  She had sleep testing in January 2021 and started home AutoPap therapy in March 2021.  She needs new supplies.  I would be happy to write for new supplies.  She is advised that she is not quite eligible for new machine since it has not been 5 years yet.  She is encouraged to be consistent with her AutoPap usage.  She would like to try a fullface mask.  She is reminded to make enough time for sleep and also change her PAP related supplies on a regular basis.  She is encouraged to follow-up routinely in our sleep clinic to see one of our nurse practitioners in 1 year.  I answered all her questions today and she was in agreement.  Thank you very much for allowing me to participate in the care of this nice patient. If I can be of any further assistance to you please do not hesitate to call me at [phone].  Sincerely,   True Mar, MD, PhD

## 2024-01-10 NOTE — Patient Instructions (Signed)
It was nice to see you again today.  Please use your AutoPap consistently.  I will write for new supplies.  Please follow-up routinely to see one of our nurse practitioners in sleep clinic in 1 year.

## 2024-01-11 ENCOUNTER — Telehealth: Payer: Self-pay | Admitting: *Deleted

## 2024-01-11 NOTE — Telephone Encounter (Signed)
 Cpap supplies order sent to [facility].

## 2024-01-12 NOTE — Telephone Encounter (Signed)
 Jessica [name], [name], [name], RN; Alain Honey; Jeris Penta, Jessica [name] [place]; 1 other Received, thank you!

## 2024-01-26 ENCOUNTER — Encounter (HOSPITAL_BASED_OUTPATIENT_CLINIC_OR_DEPARTMENT_OTHER): Payer: BC Managed Care – PPO | Admitting: Cardiovascular Disease

## 2024-01-30 ENCOUNTER — Ambulatory Visit (HOSPITAL_BASED_OUTPATIENT_CLINIC_OR_DEPARTMENT_OTHER): Payer: 59 | Admitting: Cardiovascular Disease

## 2024-01-30 ENCOUNTER — Encounter (HOSPITAL_BASED_OUTPATIENT_CLINIC_OR_DEPARTMENT_OTHER): Payer: Self-pay | Admitting: Cardiovascular Disease

## 2024-01-30 VITALS — BP 146/102 | HR 87 | Ht 66.0 in | Wt 258.7 lb

## 2024-01-30 DIAGNOSIS — E66812 Obesity, class 2: Secondary | ICD-10-CM | POA: Diagnosis not present

## 2024-01-30 DIAGNOSIS — I1A0 Resistant hypertension: Secondary | ICD-10-CM

## 2024-01-30 DIAGNOSIS — I5032 Chronic diastolic (congestive) heart failure: Secondary | ICD-10-CM | POA: Diagnosis not present

## 2024-01-30 DIAGNOSIS — G4733 Obstructive sleep apnea (adult) (pediatric): Secondary | ICD-10-CM

## 2024-01-30 DIAGNOSIS — Z6836 Body mass index (BMI) 36.0-36.9, adult: Secondary | ICD-10-CM

## 2024-01-30 DIAGNOSIS — E6609 Other obesity due to excess calories: Secondary | ICD-10-CM

## 2024-01-30 DIAGNOSIS — Z5181 Encounter for therapeutic drug level monitoring: Secondary | ICD-10-CM

## 2024-01-30 MED ORDER — CHLORTHALIDONE 25 MG PO TABS: 25.0000 mg | ORAL_TABLET | Freq: Every day | ORAL | 3 refills | Status: AC

## 2024-01-30 MED ORDER — ATORVASTATIN CALCIUM 80 MG PO TABS: 80.0000 mg | ORAL_TABLET | Freq: Every day | ORAL | 3 refills | Status: DC

## 2024-01-30 NOTE — Progress Notes (Signed)
 Advanced Hypertension Clinic Follow Up:    Date:  01/30/2024   ID:  Jessica [name], DOB 06-May-1971, MRN [id number]  PCP:  Fatima Sanger, FNP  Cardiologist:  None  Nephrologist Referring MD: Fatima Sanger, FNP   CC: Hypertension  History of Present Illness:    Jessica [name] is a 53 y.o. female with a hx of HFpEF, CAD, hypertension, pre-eclampsia, hyperlipidemia, morbid obesity, OSA on prn CPAP, here for follow-up.  She first establish care in the advanced hypertension clinic 09/2023.  Previously followed by [facility] with Dr. Rosemary Holms and last seen by Elvin So, PA-C 03/18/2022. At a prior visit she had EKG changes concerning for ischemia and had a coronary CTA revealing mild nonobstructive CAD with a coronary calcium score of 8.13. She was started on Zetia 10 mg daily. Blood pressures remained uncontrolled and she did not wish to add antihypertensive medications.  She presented to the ER on 06/12/2023 with complaints of slurred speech, right arm numbness/weakness, and found to be in hypertensive crisis to 266/71. MRI was negative for acute stroke but did show progressive disease related to chronic uncontrolled hypertension. Troponin x2 was negative. Blood pressures improved after receiving Lasix and hydralazine.  Seen by Lauretta Chester, FNP 06/23/2023 and was referred to Advanced HTN clinic due to recent hypertensive crisis and noncompliance with meds. She was continued on a regimen of minoxidil 2.5 mg daily, carvedilol 12.5 mg BID, losartan 100 mg daily, spironolactone 50 mg daily.  She reported 23 years of struggling with hypertension and had preeclampsia resulting in early delivery.  In the office her blood pressure was in the 170s over 110s.  At home it was averaging in the 120s over 70s to 80s.  Losartan was switched to valsartan and she was referred to the right start program.  She also enrolled in the remote patient monitoring study.  In the interim her  dermatologist increased her minoxidil to 2.5 mg daily.  She reported home blood pressure readings in the 160s over 90s.  When she followed up with our pharmacist minoxidil was increased to twice daily.  At her visit 11/2023 spironolactone was increased to twice daily.  She was started back on CPAP 01/2024.  Jessica [name] has a history of hypertension and has been monitoring her blood pressure at home. Recent readings have been consistently high, with values such as 150/93, 153/93, 182/97, 159/81, and 169/92. She mentions that her blood pressure was elevated due to rushing to the appointment. She has been on minoxidil, but it does not seem to be effective in controlling her blood pressure. She is currently taking carvedilol, valsartan, and spironolactone. She has experienced nausea with hydrochlorothiazide in the past but has not tried chlorthalidone.  She has a history of heart failure and mentions that carvedilol was effective in lowering her blood pressure when she was first diagnosed two years ago. She is awaiting a full face mask for her CPAP machine, as she finds the nasal mask uncomfortable. A full face mask was used during her previous hospitalization for heart failure.  She reports a recent episode of pneumonia, which has limited her ability to exercise. She was coughing heavily every five seconds, which she believes caused musculoskeletal chest discomfort. The cough has improved, but she still experiences some discomfort at night and early in the morning on one side of her chest. She took over-the-counter cough medicine during this time.  She is a Museum/gallery exhibitions officer and attributes some of her stress and elevated  blood pressure to her work environment. She has been participating in an exercise program but had to pause due to pneumonia. She plans to resume the program, which includes treadmill, leg press, and chest exercises, two days a week.      Previous antihypertensives: Amlodipine -  swelling Hydrochlorothiazide- nausea  Past Medical History:  Diagnosis Date   Aortic atherosclerosis (HCC)    per coronary CTA   Coronary artery disease    cardiologist--- dr Rosemary Holms;   per coronary CT w/ CTA 03-15-2022  mild nonob cad   Heart failure with preserved ejection fraction [facility])    cardiologist--- patwardhan;   per echo 07-22-2021  EF 57%, grade 3 diastolic dysfunction (progressed from grade 2), elevated LAP   Hyperlipidemia    Hypertension    followed by pcp  and cardiologist   OSA on CPAP    10-20-2022  per pt uses as needed,  stated average 2 times per week   (followed by dr Frances Furbish)   Pure hypercholesterolemia 09/26/2023   Wears glasses     Past Surgical History:  Procedure Laterality Date   BUNIONECTOMY Left    06/ 2022 w/ hammertoe correction and 06/ 2023 left bunion both @ [facility]   DILITATION & CURRETTAGE/HYSTROSCOPY WITH NOVASURE ABLATION N/A 10/22/2022   Procedure: DILATATION & CURETTAGE/HYSTEROSCOPY WITH NOVASURE ABLATION;  Surgeon: Charlett Nose, MD;  Location: [facility];  Service: Gynecology;  Laterality: N/A;   WRIST GANGLION EXCISION Left 1993    Current Medications: Current Meds  Medication Sig   albuterol (VENTOLIN HFA) 108 (90 Base) MCG/ACT inhaler Inhale 1-2 puffs into the lungs as needed for shortness of breath or wheezing.   carvedilol (COREG) 12.5 MG tablet TAKE 1 TABLET(12.5 MG) BY MOUTH TWICE DAILY WITH A MEAL (Patient taking differently: Take 12.5 mg by mouth 2 (two) times daily with a meal. TAKE 1 TABLET(12.5 MG) BY MOUTH TWICE DAILY WITH A MEAL)   chlorthalidone (HYGROTON) 25 MG tablet Take 1 tablet (25 mg total) by mouth daily.   ezetimibe (ZETIA) 10 MG tablet Take 10 mg by mouth daily.   minoxidil (LONITEN) 2.5 MG tablet Take 1 tablet (2.5 mg total) by mouth 2 (two) times daily.   OVER THE COUNTER MEDICATION Take 1 each by mouth daily. Beets chews.   Prenatal Vit-Fe Fumarate-FA (PRENATAL PO) Take 1 tablet by mouth  daily.   spironolactone (ALDACTONE) 50 MG tablet Take 50 mg by mouth 2 (two) times daily.   valsartan (DIOVAN) 320 MG tablet Take 1 tablet (320 mg total) by mouth daily.   Vitamin D, Ergocalciferol, (DRISDOL) 1.25 MG (50000 UNIT) CAPS capsule Take 50,000 Units by mouth every Thursday.   [DISCONTINUED] atorvastatin (LIPITOR) 40 MG tablet Take 40 mg by mouth daily.     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  Tobacco Use   Smoking status: Never   Smokeless tobacco: Never  Vaping Use   Vaping status: Never Used  Substance and Sexual Activity   Alcohol use: Never   Drug use: Never   Sexual activity: Not Currently    Birth control/protection: None  Other Topics Concern   Not on file  Social  History Narrative   Not on file   Social Drivers of Health   Financial Resource Strain: Not on file  Food Insecurity: Not on file  Transportation Needs: Not on file  Physical Activity: Not on file  Stress: Not on file  Social Connections: Not on file     Family History: The patient's family history includes Colon cancer in her brother; Heart disease in her sister; Hypertension in her mother, sister, and sister; Stroke in her mother, sister, and sister.  ROS:   Please see the history of present illness. All other systems reviewed and are negative.  EKGs/Labs/Other Studies Reviewed:    Coronary CTA  03/15/2022: IMPRESSION: 1. Total coronary calcium score of 8.13. This was 91st percentile for age and sex matched control.   2. Normal coronary origin with left dominance.   3. Artifact: Moderate. (signal-to-noise (BMI 45)/ motion).   4. CAD-RADS = 2 Mild non-obstructive CAD.    Left main: Patent.   LAD: Patent.   LCX: Mild stenosis (25-49%) closer to 25% at mid LCx due to non calcified plaque.   RCA: Mild stenosis (25-49%) at proximal RCA due to eccentric noncalcified plaque.   5. Aortic atherosclerosis.   6. Main pulmonary artery mildly dilated without proximal filling defect.   RECOMMENDATIONS: Consider non-atherosclerotic causes of chest pain. Consider preventive therapy and risk factor modification.   EKG:  EKG is personally reviewed. 09/21/2023: Not ordered.  Recent Labs: 06/12/2023: ALT 20; B Natriuretic Peptide 286.8; Hemoglobin 15.0; Platelets 305 09/23/2023: BUN 11; Creatinine, Ser 0.96; Potassium 4.3; Sodium 137; TSH 0.904   Recent Lipid Panel No results found for: "CHOL", "TRIG", "HDL", "CHOLHDL", "VLDL", "LDLCALC", "LDLDIRECT"  Physical Exam:    VS:  BP (!) 146/102   Pulse 87   Ht 5\' 6"  (1.676 m)   Wt 258 lb 11.2 oz (117.3 kg)   SpO2 92%   BMI 41.76 kg/m  , BMI Body mass index is 41.76 kg/m. GENERAL:  Well appearing HEENT: Pupils equal round and reactive, fundi not visualized, oral mucosa unremarkable NECK:  No jugular venous distention, waveform within normal limits, carotid upstroke brisk and symmetric, no bruits, no thyromegaly LUNGS:  Clear to auscultation bilaterally HEART:  RRR.  PMI not displaced or sustained, S1 and S2 within normal limits, no S3, no S4, no clicks, no rubs, no murmurs ABD:  Flat, positive bowel sounds normal in frequency in pitch, no bruits, no rebound, no guarding, no midline pulsatile mass, no hepatomegaly, no splenomegaly EXT:  2 plus pulses throughout, no edema, no cyanosis, no clubbing SKIN:  No rashes, no nodules NEURO:  Cranial nerves II through XII grossly intact, motor grossly intact throughout PSYCH:  Cognitively intact, oriented to person place and time   ASSESSMENT/PLAN:    # Hypertension # HFpEF: Persistent elevated blood pressure despite current regimen of carvedilol, valsartan, and  spironolactone. Recent addition of minoxidil has not provided sufficient control. Patient has been experiencing a cough, possibly contributing to musculoskeletal chest discomfort. -Discontinue carvedilol. -Initiate chlorthalidone once daily in the morning. -Check basic metabolic panel in one week to assess electrolyte balance post-initiation of chlorthalidone. -Continue monitoring blood pressure at home and bring records to next appointment.  # Hyperlipidemia LDL above target level despite current atorvastatin 40mg  daily regimen. -Increase atorvastatin to 80mg  daily. -Continue Zetia -Recheck lipid panel in three months.  General Health Maintenance / Follow-up Plans -Continue weight loss efforts and dietary modifications. -Resume exercise program when able, following recent pneumonia. -Consider renal denervation procedure if blood  pressure remains uncontrolled despite medication adjustments. -Follow-up appointment in 1-2 months to assess blood pressure control and medication tolerability.      Screening for Secondary Hypertension:     09/21/2023    3:18 PM  Causes  Drugs/Herbals Screened     - Comments no caffeine or EtOH.  Occasional NSAIDS  Renovascular HTN Screened     - Comments MRI normla in 2008  Sleep Apnea Screened     - Comments not using CPAP regularly  Thyroid Disease Screened     - Comments check TSH  Hyperaldosteronism Screened     - Comments check renin and aldosterone  Pheochromocytoma N/A  Cushing's Syndrome Screened  Hyperparathyroidism N/A  Coarctation of the Aorta Screened     - Comments BP symmetric  Compliance Screened    Relevant Labs/Studies:    Latest Ref Rng & Units 09/23/2023   10:40 AM 06/12/2023    9:53 PM 06/12/2023    8:11 PM  Basic Labs  Sodium 134 - 144 mmol/L 137  135  136   Potassium 3.5 - 5.2 mmol/L 4.3  3.7  3.9   Creatinine 0.57 - 1.00 mg/dL 7.25        Disposition:    FU with APP/PharmD/MD in 1-2 months  Medication  Adjustments/Labs and Tests Ordered: Current medicines are reviewed at length with the patient today.  Concerns regarding medicines are outlined above.   Orders Placed This Encounter  Procedures   Basic metabolic panel   Lipid panel   Comprehensive metabolic panel   Meds ordered this encounter  Medications   chlorthalidone (HYGROTON) 25 MG tablet    Sig: Take 1 tablet (25 mg total) by mouth daily.    Dispense:  90 tablet    Refill:  3   atorvastatin (LIPITOR) 80 MG tablet    Sig: Take 1 tablet (80 mg total) by mouth daily.    Dispense:  90 tablet    Refill:  3     Signed, Chilton Si, MD  01/30/2024 4:06 PM    [facility]

## 2024-01-30 NOTE — Patient Instructions (Signed)
 Medication Instructions:  START CHLORTHALIDONE 25 MG DAILY   INCREASE YOUR ATORVASTATIN TO 80 MG DAILY   Labwork: BMET IN 1 WEEK   FASTING LP/CMET IN 3 MONTHS   Testing/Procedures: NONE   Follow-Up: 1 TO 2 MONTHS WITH DR Reubens OR CAITLIN W NP   If you need a refill on your cardiac medications before your next appointment, please call your pharmacy.

## 2024-02-11 LAB — BASIC METABOLIC PANEL
BUN/Creatinine Ratio: 19 (ref 9–23)
BUN: 18 mg/dL (ref 6–24)
CO2: 22 mmol/L (ref 20–29)
Calcium: 9.7 mg/dL (ref 8.7–10.2)
Chloride: 100 mmol/L (ref 96–106)
Creatinine, Ser: 0.97 mg/dL (ref 0.57–1.00)
Glucose: 85 mg/dL (ref 70–99)
Potassium: 3.6 mmol/L (ref 3.5–5.2)
Sodium: 138 mmol/L (ref 134–144)
eGFR: 71 mL/min/{1.73_m2} (ref >59)

## 2024-02-13 NOTE — Telephone Encounter (Signed)
 Pt called stating she is wanting to speak to the nurse to see if her cpap filter is covered by her insurance.

## 2024-02-14 NOTE — Telephone Encounter (Signed)
 I called pt and she will call [employer], relating to her needs (requesting new mask(, I told her I did not know criteria if once gets mask if can get another right away).  She will contact [phone]. She will call back if issues.  She verbalized understanding of plan.

## 2024-02-25 ENCOUNTER — Other Ambulatory Visit (HOSPITAL_BASED_OUTPATIENT_CLINIC_OR_DEPARTMENT_OTHER): Payer: Self-pay | Admitting: Cardiovascular Disease

## 2024-03-04 ENCOUNTER — Encounter (HOSPITAL_BASED_OUTPATIENT_CLINIC_OR_DEPARTMENT_OTHER): Payer: Self-pay | Admitting: Cardiovascular Disease

## 2024-03-05 ENCOUNTER — Encounter (HOSPITAL_BASED_OUTPATIENT_CLINIC_OR_DEPARTMENT_OTHER): Payer: Self-pay

## 2024-03-06 ENCOUNTER — Encounter (HOSPITAL_BASED_OUTPATIENT_CLINIC_OR_DEPARTMENT_OTHER): Payer: Self-pay | Admitting: Cardiovascular Disease

## 2024-03-06 ENCOUNTER — Ambulatory Visit (HOSPITAL_BASED_OUTPATIENT_CLINIC_OR_DEPARTMENT_OTHER): Payer: 59 | Admitting: Cardiovascular Disease

## 2024-03-06 VITALS — BP 136/84 | HR 83 | Ht 66.0 in | Wt 255.4 lb

## 2024-03-06 DIAGNOSIS — G4733 Obstructive sleep apnea (adult) (pediatric): Secondary | ICD-10-CM

## 2024-03-06 DIAGNOSIS — I10 Essential (primary) hypertension: Secondary | ICD-10-CM | POA: Diagnosis not present

## 2024-03-06 DIAGNOSIS — I5032 Chronic diastolic (congestive) heart failure: Secondary | ICD-10-CM

## 2024-03-06 DIAGNOSIS — I1A0 Resistant hypertension: Secondary | ICD-10-CM

## 2024-03-06 DIAGNOSIS — Z5181 Encounter for therapeutic drug level monitoring: Secondary | ICD-10-CM

## 2024-03-06 DIAGNOSIS — E7800 Pure hypercholesterolemia, unspecified: Secondary | ICD-10-CM

## 2024-03-06 NOTE — Progress Notes (Signed)
 Advanced Hypertension Clinic Follow Up:    Date:  03/06/2024   ID:  Jessica [name], DOB 04/14/1971, MRN [id number]  PCP:  Fatima Sanger, FNP  Cardiologist:  None  Referring MD: Fatima Sanger, FNP   CC: Hypertension  History of Present Illness:    Jessica [name] is a 53 y.o. female with a hx of HFpEF, CAD, hypertension, pre-eclampsia, hyperlipidemia, morbid obesity, OSA on prn CPAP, here for follow-up.  She first establish care in the advanced hypertension clinic 09/2023.  Previously followed by [facility] with Dr. Rosemary Holms and last seen by Elvin So, PA-C 03/18/2022. At a prior visit she had EKG changes concerning for ischemia and had a coronary CTA revealing mild nonobstructive CAD with a coronary calcium score of 8.13. She was started on Zetia 10 mg daily. Blood pressures remained uncontrolled and she did not wish to add antihypertensive medications.  She presented to the ER on 06/12/2023 with complaints of slurred speech, right arm numbness/weakness, and found to be in hypertensive crisis to 266/71. MRI was negative for acute stroke but did show progressive disease related to chronic uncontrolled hypertension. Troponin x2 was negative. Blood pressures improved after receiving Lasix and hydralazine.  Seen by Lauretta Chester, FNP 06/23/2023 and was referred to Advanced HTN clinic due to recent hypertensive crisis and noncompliance with meds. She was continued on a regimen of minoxidil 2.5 mg daily, carvedilol 12.5 mg BID, losartan 100 mg daily, spironolactone 50 mg daily.  She reported 23 years of struggling with hypertension and had preeclampsia resulting in early delivery.  In the office her blood pressure was in the 170s over 110s.  At home it was averaging in the 120s over 70s to 80s.  Losartan was switched to valsartan and she was referred to the right start program.  She also enrolled in the remote patient monitoring study.  In the interim her dermatologist  increased her minoxidil to 2.5 mg daily.  She reported home blood pressure readings in the 160s over 90s.  When she followed up with our pharmacist minoxidil was increased to twice daily.  At her visit 11/2023 spironolactone was increased to twice daily.  She was started back on CPAP 01/2024.  On 01/2024 BP was in the 150-180s.  She noted that minoxidil didn't sem to be helping.  At that time she was started on chlorthalidone.    Discussed the use of AI scribe software for clinical note transcription with the patient, who gave verbal consent to proceed.  History of Present Illness Jessica [name] is a 53 year old female with hypertension who presents for blood pressure management.  Home blood pressure readings range from the 130s to 160s systolic over 60s to 70s diastolic. She notes improvement, stating her blood pressure is 'not in that crazy ninety something anymore'. Current medications include carvedilol, chlorthalidone, minoxidil, valsartan, spironolactone, Zetia, and atorvastatin. She is concerned about adding more medications but is relieved that no new medications are being added at this time.  She recently recovered from pneumonia, which affected her appetite and caused increased coughing. She received clearance from urgent care about two weeks ago. During her illness, her weight fluctuated, but she notes a decrease to 'thirty something' on her home scale, although the office scale shows a weight of 255 pounds, down from 258 pounds in February.  She has not been exercising recently due to her recovery from pneumonia and a previous accident. She plans to become more active now that she has  received medical clearance and the weather is improving. She intends to exercise after work, aiming for 150 minutes per week, and plans to bring her exercise clothes to work to facilitate this routine. No current issues with the fluid pills and no new symptoms or concerns. Kidney function and electrolytes are  normal.   Previous antihypertensives: Amlodipine - swelling Hydrochlorothiazide- nausea  Past Medical History:  Diagnosis Date   Aortic atherosclerosis (HCC)    per coronary CTA   Coronary artery disease    cardiologist--- dr Rosemary Holms;   per coronary CT w/ CTA 03-15-2022  mild nonob cad   Heart failure with preserved ejection fraction [facility])    cardiologist--- patwardhan;   per echo 07-22-2021  EF 57%, grade 3 diastolic dysfunction (progressed from grade 2), elevated LAP   Hyperlipidemia    Hypertension    followed by pcp  and cardiologist   OSA on CPAP    10-20-2022  per pt uses as needed,  stated average 2 times per week   (followed by dr Frances Furbish)   Pure hypercholesterolemia 09/26/2023   Wears glasses     Past Surgical History:  Procedure Laterality Date   BUNIONECTOMY Left    06/ 2022 w/ hammertoe correction and 06/ 2023 left bunion both @ [facility]   DILITATION & CURRETTAGE/HYSTROSCOPY WITH NOVASURE ABLATION N/A 10/22/2022   Procedure: DILATATION & CURETTAGE/HYSTEROSCOPY WITH NOVASURE ABLATION;  Surgeon: Charlett Nose, MD;  Location: [facility];  Service: Gynecology;  Laterality: N/A;   WRIST GANGLION EXCISION Left 1993    Current Medications: Current Meds  Medication Sig   albuterol (VENTOLIN HFA) 108 (90 Base) MCG/ACT inhaler Inhale 1-2 puffs into the lungs as needed for shortness of breath or wheezing.   atorvastatin (LIPITOR) 80 MG tablet Take 1 tablet (80 mg total) by mouth daily.   carvedilol (COREG) 12.5 MG tablet TAKE 1 TABLET(12.5 MG) BY MOUTH TWICE DAILY WITH A MEAL (Patient taking differently: Take 12.5 mg by mouth 2 (two) times daily with a meal. TAKE 1 TABLET(12.5 MG) BY MOUTH TWICE DAILY WITH A MEAL)   chlorthalidone (HYGROTON) 25 MG tablet Take 1 tablet (25 mg total) by mouth daily.   ezetimibe (ZETIA) 10 MG tablet Take 10 mg by mouth daily.   minoxidil (LONITEN) 2.5 MG tablet Take 1 tablet by mouth twice daily   OVER THE COUNTER MEDICATION  Take 1 each by mouth daily. Beets chews.   Prenatal Vit-Fe Fumarate-FA (PRENATAL PO) Take 1 tablet by mouth daily.   spironolactone (ALDACTONE) 50 MG tablet Take 50 mg by mouth 2 (two) times daily.   valsartan (DIOVAN) 320 MG tablet Take 1 tablet (320 mg total) by mouth daily.   Vitamin D, Ergocalciferol, (DRISDOL) 1.25 MG (50000 UNIT) CAPS capsule Take 50,000 Units by mouth every Thursday.     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  Tobacco Use   Smoking status: Never   Smokeless tobacco: Never  Vaping Use   Vaping status: Never Used  Substance and Sexual Activity   Alcohol use: Never   Drug use: Never   Sexual activity: Not Currently    Birth control/protection: None  Other Topics Concern  Not on file  Social History Narrative   Not on file   Social Drivers of Health   Financial Resource Strain: Not on file  Food Insecurity: Not on file  Transportation Needs: Not on file  Physical Activity: Not on file  Stress: Not on file  Social Connections: Not on file     Family History: The patient's family history includes Colon cancer in her brother; Heart disease in her sister; Hypertension in her mother, sister, and sister; Stroke in her mother, sister, and sister.  ROS:   Please see the history of present illness. All other systems reviewed and are negative.  EKGs/Labs/Other Studies Reviewed:    Coronary CTA  03/15/2022: IMPRESSION: 1. Total coronary calcium score of 8.13. This was 91st percentile for age and sex matched control.   2. Normal coronary origin with left dominance.   3. Artifact: Moderate. (signal-to-noise (BMI 45)/ motion).   4. CAD-RADS = 2  Mild non-obstructive CAD.   Left main: Patent.   LAD: Patent.   LCX: Mild stenosis (25-49%) closer to 25% at mid LCx due to non calcified plaque.   RCA: Mild stenosis (25-49%) at proximal RCA due to eccentric noncalcified plaque.   5. Aortic atherosclerosis.   6. Main pulmonary artery mildly dilated without proximal filling defect.   RECOMMENDATIONS: Consider non-atherosclerotic causes of chest pain. Consider preventive therapy and risk factor modification.   EKG:  EKG is personally reviewed. 09/21/2023: Not ordered.  Recent Labs: 06/12/2023: ALT 20; B Natriuretic Peptide 286.8; Hemoglobin 15.0; Platelets 305 09/23/2023: TSH 0.904 02/10/2024: BUN 18; Creatinine, Ser 0.97; Potassium 3.6; Sodium 138   Recent Lipid Panel No results found for: "CHOL", "TRIG", "HDL", "CHOLHDL", "VLDL", "LDLCALC", "LDLDIRECT"  Physical Exam:    VS:  BP (!) 145/84 (BP Location: Left Arm, Patient Position: Sitting, Cuff Size: Normal)   Pulse 83   Ht 5\' 6"  (1.676 m)   Wt 255 lb 6.4 oz (115.8 kg)   SpO2 100%   BMI 41.22 kg/m  , BMI Body mass index is 41.22 kg/m. GENERAL:  Well appearing HEENT: Pupils equal round and reactive, fundi not visualized, oral mucosa unremarkable NECK:  No jugular venous distention, waveform within normal limits, carotid upstroke brisk and symmetric, no bruits, no thyromegaly LUNGS:  Clear to auscultation bilaterally HEART:  RRR.  PMI not displaced or sustained, S1 and S2 within normal limits, no S3, no S4, no clicks, no rubs, no murmurs ABD:  Flat, positive bowel sounds normal in frequency in pitch, no bruits, no rebound, no guarding, no midline pulsatile mass, no hepatomegaly, no splenomegaly EXT:  2 plus pulses throughout, no edema, no cyanosis, no clubbing SKIN:  No rashes, no nodules NEURO:  Cranial nerves II through XII grossly intact, motor grossly intact throughout PSYCH:  Cognitively intact, oriented to person place and time   ASSESSMENT/PLAN:     Assessment and Plan Assessment & Plan # Hypertension Hypertension remains above target with home readings of 130s-160s systolic over 60s-80s diastolic. Current management includes multiple antihypertensives. Emphasized maintaining blood pressure under 130/80 mmHg. No issues with current medications, including diuretics. Discussed potential future interventions such as renal denervation, FDA-approved in 2023, showing good results in 10-11 patients with resistant hypertension, and new injectable medications in clinical trials. Emphasized lifestyle modifications, including exercise and dietary changes.  She is not interested in adding any additional medications at this time.  - Continue carvedilol, chlorthalidone, valsartan, spironolactone. - Encourage lifestyle modifications: aim for 150 minutes of exercise per week and monitor dietary  salt intake. - Discuss renal denervation as a potential future option if hypertension remains resistant. - Consider new injectable antihypertensive medications when available.  # CAD:  # Hyperlipidemia Mild plaque on previous CT scan, more than expected for age. Current management includes cholesterol-lowering medications. No symptoms such as chest pain or dyspnea. Emphasized continuing medication to prevent plaque progression. - Continue Zetia and atorvastatin. - Monitor lipid levels during follow-up. - LDL goal <70  # HFpEF:  She is euvolemic.  BP control as above.  # Morbid obesity:  Working on diet and exercise as above.   Follow-up Plan to reassess blood pressure control and overall cardiovascular health in four months. Evaluate effectiveness of current management and consider additional interventions if necessary. - Schedule follow-up appointment in four months. - Obtain fasting labs, including liver panel and comprehensive metabolic panel, one week prior to the next appointment.    Screening for Secondary Hypertension:     09/21/2023    3:18 PM   Causes  Drugs/Herbals Screened     - Comments no caffeine or EtOH.  Occasional NSAIDS  Renovascular HTN Screened     - Comments MRI normla in 2008  Sleep Apnea Screened     - Comments not using CPAP regularly  Thyroid Disease Screened     - Comments check TSH  Hyperaldosteronism Screened     - Comments check renin and aldosterone  Pheochromocytoma N/A  Cushing's Syndrome Screened  Hyperparathyroidism N/A  Coarctation of the Aorta Screened     - Comments BP symmetric  Compliance Screened    Relevant Labs/Studies:    Latest Ref Rng & Units 02/10/2024    4:00 PM 09/23/2023   10:40 AM 06/12/2023    9:53 PM  Basic Labs  Sodium 134 - 144 mmol/L 138  137  135   Potassium 3.5 - 5.2 mmol/L 3.6  4.3  3.7   Creatinine 0.57 - 1.00 mg/dL 1.61  0.96       Disposition:    FU with APP/PharmD/MD in 4 months  Medication Adjustments/Labs and Tests Ordered: Current medicines are reviewed at length with the patient today.  Concerns regarding medicines are outlined above.   No orders of the defined types were placed in this encounter.  No orders of the defined types were placed in this encounter.    Signed, Chilton Si, MD  03/06/2024 4:22 PM     [facility]

## 2024-03-06 NOTE — Patient Instructions (Signed)
 Medication Instructions:  Your physician recommends that you continue on your current medications as directed. Please refer to the Current Medication list given to you today.   Labwork: FASTING LP/CMET ABOUT 1 WEEK PRIOR TO FOLLOW UP   Testing/Procedures: NONE  Follow-Up: 4  MONTHS   Any Other Special Instructions Will Be Listed Below (If Applicable). WORK ON DIET AND EXERCISE  Exercise recommendations: The American Heart Association recommends 150 minutes of moderate intensity exercise weekly. Try 30 minutes of moderate intensity exercise 4-5 times per week. This could include walking, jogging, or swimming.   If you need a refill on your cardiac medications before your next appointment, please call your pharmacy.

## 2024-04-20 ENCOUNTER — Other Ambulatory Visit (HOSPITAL_BASED_OUTPATIENT_CLINIC_OR_DEPARTMENT_OTHER): Payer: Self-pay | Admitting: Cardiovascular Disease

## 2024-04-29 ENCOUNTER — Encounter (HOSPITAL_BASED_OUTPATIENT_CLINIC_OR_DEPARTMENT_OTHER): Payer: Self-pay | Admitting: Cardiovascular Disease

## 2024-05-18 MED ORDER — ROSUVASTATIN CALCIUM 20 MG PO TABS: 20.0000 mg | ORAL_TABLET | Freq: Every day | ORAL | 3 refills | Status: DC

## 2024-05-24 NOTE — Telephone Encounter (Signed)
 Please advise if okay to switch to repatha

## 2024-06-26 ENCOUNTER — Ambulatory Visit (INDEPENDENT_AMBULATORY_CARE_PROVIDER_SITE_OTHER): Admitting: Cardiovascular Disease

## 2024-06-26 ENCOUNTER — Encounter (HOSPITAL_BASED_OUTPATIENT_CLINIC_OR_DEPARTMENT_OTHER): Payer: Self-pay | Admitting: Cardiovascular Disease

## 2024-06-26 ENCOUNTER — Other Ambulatory Visit (HOSPITAL_COMMUNITY): Payer: Self-pay

## 2024-06-26 ENCOUNTER — Telehealth (HOSPITAL_BASED_OUTPATIENT_CLINIC_OR_DEPARTMENT_OTHER): Payer: Self-pay | Admitting: *Deleted

## 2024-06-26 ENCOUNTER — Telehealth: Payer: Self-pay

## 2024-06-26 VITALS — BP 110/66 | HR 79 | Ht 66.0 in | Wt 259.0 lb

## 2024-06-26 DIAGNOSIS — Z5181 Encounter for therapeutic drug level monitoring: Secondary | ICD-10-CM

## 2024-06-26 DIAGNOSIS — I5032 Chronic diastolic (congestive) heart failure: Secondary | ICD-10-CM

## 2024-06-26 DIAGNOSIS — E7800 Pure hypercholesterolemia, unspecified: Secondary | ICD-10-CM | POA: Diagnosis not present

## 2024-06-26 DIAGNOSIS — G4733 Obstructive sleep apnea (adult) (pediatric): Secondary | ICD-10-CM | POA: Diagnosis not present

## 2024-06-26 DIAGNOSIS — I1A0 Resistant hypertension: Secondary | ICD-10-CM | POA: Diagnosis not present

## 2024-06-26 MED ORDER — REPATHA SURECLICK 140 MG/ML ~~LOC~~ SOAJ: 140.0000 mg | SUBCUTANEOUS | 2 refills | Status: DC

## 2024-06-26 NOTE — Patient Instructions (Addendum)
 Medication Instructions:  STOP EZETIMIBE    STOP ROSUVASTATIN    START REPATHA  INJECT EVERY 14 DAYS   Labwork: FASTING LP/CMET IN 2 TO 3 MONTHS   Testing/Procedures: NONE  Follow-Up: 6 MONTHS WITH DR , CAITLIN W NP, OR MICHELLE S NP   If you need a refill on your cardiac medications before your next appointment, please call your pharmacy.  Subcutaneous Injection Instructions Using a Prefilled Syringe A subcutaneous injection is a shot of medicine that is given into the layer of fat and tissue between skin and muscle. The injection is given with a single-use syringe that is already filled with medicine. The syringe is called a prefilled syringe. Read the medicine guide or package insert that came with the syringe. Follow directions from the guide about how to prepare and give the injection. This is important because the directions may be different for each medicine. Use only the syringe, needle, and medicine that your health care provider prescribes. Use each prefilled syringe and needle only one time. Supplies needed: Prefilled syringe with needle. Use the needle length and size (gauge) that your provider or pharmacist gives to you. Alcohol wipes. Gauze. Bandage. A container to put used syringes. This may be a sharps container or a hard plastic container that has a secure lid, such as an empty laundry detergent bottle. How to choose a site for injection Follow instructions from your provider about where to give an injection. Do not inject in the same spot each time. There are five main areas that can be used for injecting. These areas include: Abdomen. Avoid the area that is within 2 inches (5 cm) of your navel (umbilicus). Front of thigh. Upper, outer side of thigh. Upper, outer side of arm. Upper, outer part of butt. How to give an injection using a prefilled syringe  Wash your hands with soap and water. If soap and water are not available, use hand sanitizer. Use an  alcohol wipe to clean the site where you will be injecting the needle. Let the site air-dry. Remove the plastic cover from the needle on the syringe. Do not let the needle touch anything. Hold the syringe with the needle pointing up. Check the syringe for any remaining air bubbles. If there are air bubbles, flick the syringe with your finger until the air bubbles rise to the top. Then, gently push on the plunger until you can see a drop of medicine appear at the tip of the needle. This will clear any remaining air bubbles from the syringe. Hold the syringe in your writing hand like a pencil. Use your other hand to pinch and hold about an inch (2.5 cm) of skin. Do not directly touch the cleaned part of the skin. Gently but quickly, put the needle straight into the skin. The needle should be at a 90-degree angle to the skin. The needle may need to be injected at a 45-degree angle in thin adults or children who have a small amount of body fat. Follow instructions from your provider about the right size needle and angle you should use for the injection. After the needle is completely inserted into the skin, release the skin that you are pinching. Continue to hold the syringe with your writing hand. Use your thumb or index finger of your writing hand to push the plunger all the way into the syringe to inject the medicine. Pull the needle straight out of the skin. If there is bleeding: Press and hold a piece of gauze over the  injection site until bleeding stops. Do not rub the area. Cover the injection site with a bandage, if needed. How to safely throw away the supplies If you are using a syringe that does not have a safety system for shielding the needle after injection: Do not recap the needle. Place the syringe and needle in the disposal container. If your syringe has a safety system for shielding the needle after injection: Firmly push down on the plunger after you complete the injection. The  protective sleeve will automatically cover the needle, and you will hear a click. The click means that the needle is safely covered. Follow the disposal regulations for the area where you live. Do not use any syringe or needle more than one time. Contact a health care provider if: You have trouble giving the injection. You think that the injection was not given correctly. You have trouble with any of the supplies. The medicine causes side effects. You get a rash on your skin. A get a fever. The condition that is being treated gets worse. Get help right away if: You get any of these symptoms after the injection is given: Trouble breathing. Chest pain. A rash over most or all of your body. Swelling of the lips or tongue. Trouble swallowing. These symptoms may be an emergency. Get help right away. Call 911. Do not wait to see if the symptoms will go away. Do not drive yourself to the hospital. This information is not intended to replace advice given to you by your health care provider. Make sure you discuss any questions you have with your health care provider. Document Revised: 09/09/2022 Document Reviewed: 09/09/2022 Elsevier Patient Education  2024 [employer].

## 2024-06-26 NOTE — Telephone Encounter (Signed)
 Patient here for office visit with Dr Raford and started on Repatha  Will forward to PA team to initiate PA

## 2024-06-26 NOTE — Telephone Encounter (Signed)
 See phone note 7/22, approved

## 2024-06-26 NOTE — Progress Notes (Signed)
 Advanced Hypertension Clinic Follow Up:    Date:  06/26/2024   ID:  LETOYA STALLONE, DOB Oct 07, 1971, MRN [id number]  PCP:  Royden Ronal Czar, FNP  Cardiologist:  None  Referring MD: Royden Ronal Czar, FNP   CC: Hypertension  History of Present Illness:    Jessica [name] is a 53 y.o. female with a hx of HFpEF, CAD, hypertension, pre-eclampsia, hyperlipidemia, morbid obesity, OSA on prn CPAP, here for follow-up.  She first establish care in the advanced hypertension clinic 09/2023.  Previously followed by [facility] with Dr. Elmira and last seen by Sherran Berliner, PA-C 03/18/2022. At a prior visit she had EKG changes concerning for ischemia and had a coronary CTA revealing mild nonobstructive CAD with a coronary calcium  score of 8.13. She was started on Zetia  10 mg daily. Blood pressures remained uncontrolled and she did not wish to add antihypertensive medications.  She presented to the ER on 06/12/2023 with complaints of slurred speech, right arm numbness/weakness, and found to be in hypertensive crisis to 266/71. MRI was negative for acute stroke but did show progressive disease related to chronic uncontrolled hypertension. Troponin x2 was negative. Blood pressures improved after receiving Lasix  and hydralazine .  Seen by Ronal Royden, FNP 06/23/2023 and was referred to Advanced HTN clinic due to recent hypertensive crisis and noncompliance with meds. She was continued on a regimen of minoxidil  2.5 mg daily, carvedilol  12.5 mg BID, losartan  100 mg daily, spironolactone  50 mg daily.  She reported 23 years of struggling with hypertension and had preeclampsia resulting in early delivery.  In the office her blood pressure was in the 170s over 110s.  At home it was averaging in the 120s over 70s to 80s.  Losartan  was switched to valsartan  and she was referred to the right start program.  She also enrolled in the remote patient monitoring study.  In the interim her dermatologist  increased her minoxidil  to 2.5 mg daily.  She reported home blood pressure readings in the 160s over 90s.  When she followed up with our pharmacist minoxidil  was increased to twice daily.  At her visit 11/2023 spironolactone  was increased to twice daily.  She was started back on CPAP 01/2024.  On 01/2024 BP was in the 150-180s.  She noted that minoxidil  didn't sem to be helping.  At that time she was started on chlorthalidone .  At her visit 03/2023 blood pressures were ranging 130s to 160s.  She was noting improvement from prior.  She was recovering from a pneumonia.  We discussed increasing her exercise and considering renal denervation in the future.  Discussed the use of AI scribe software for clinical note transcription with the patient, who gave verbal consent to proceed.  Ms. Donalson experiences lower back pain, which began after engaging in exercise activities such as using the gym and walking. The pain is localized to her lower back and does not radiate. She has a history of a degenerative condition, possibly degenerative disc disease, diagnosed over ten years ago. No recent imaging studies for her back have been conducted.  No weakness or radiation.   She monitors her blood pressure regularly, with readings generally stable and an average of approximately 120/82 mmHg. No issues with breathing, chest pain, lightheadedness, or dizziness are reported.  She has a history of hyperlipidemia and has previously been on multiple statins, including atorvastatin , rosuvastatin , and simvastatin , but discontinued them due to muscle aches. Her last LDL cholesterol was in the 90s, and she stopped taking  her cholesterol medications about a month ago. She is mindful of her diet, particularly salt intake, and cooks at home more frequently to manage her blood pressure.   Previous antihypertensives: Amlodipine - swelling Hydrochlorothiazide- nausea  Past Medical History:  Diagnosis Date   Aortic atherosclerosis  (HCC)    per coronary CTA   Coronary artery disease    cardiologist--- dr elmira;   per coronary CT w/ CTA 03-15-2022  mild nonob cad   Heart failure with preserved ejection fraction [facility])    cardiologist--- patwardhan;   per echo 07-22-2021  EF 57%, grade 3 diastolic dysfunction (progressed from grade 2), elevated LAP   Hyperlipidemia    Hypertension    followed by pcp  and cardiologist   OSA on CPAP    10-20-2022  per pt uses as needed,  stated average 2 times per week   (followed by dr buck)   Pure hypercholesterolemia 09/26/2023   Wears glasses     Past Surgical History:  Procedure Laterality Date   BUNIONECTOMY Left    06/ 2022 w/ hammertoe correction and 06/ 2023 left bunion both @ [facility]   DILITATION & CURRETTAGE/HYSTROSCOPY WITH NOVASURE ABLATION N/A 10/22/2022   Procedure: DILATATION & CURETTAGE/HYSTEROSCOPY WITH NOVASURE ABLATION;  Surgeon: Diedre Rosaline BRAVO, MD;  Location: [facility];  Service: Gynecology;  Laterality: N/A;   WRIST GANGLION EXCISION Left 1993    Current Medications: Current Meds  Medication Sig   albuterol  (VENTOLIN  HFA) 108 (90 Base) MCG/ACT inhaler Inhale 1-2 puffs into the lungs as needed for shortness of breath or wheezing.   carvedilol  (COREG ) 12.5 MG tablet TAKE 1 TABLET(12.5 MG) BY MOUTH TWICE DAILY WITH A MEAL   chlorthalidone  (HYGROTON ) 25 MG tablet Take 1 tablet (25 mg total) by mouth daily.   Evolocumab  (REPATHA  SURECLICK) 140 MG/ML SOAJ Inject 140 mg into the skin every 14 (fourteen) days.   minoxidil  (LONITEN ) 2.5 MG tablet Take 1 tablet by mouth twice daily   OVER THE COUNTER MEDICATION Take 1 each by mouth daily. Beets chews.   Prenatal Vit-Fe Fumarate-FA (PRENATAL PO) Take 1 tablet by mouth daily.   spironolactone  (ALDACTONE ) 50 MG tablet Take 50 mg by mouth 2 (two) times daily.   valsartan  (DIOVAN ) 320 MG tablet Take 1 tablet (320 mg total) by mouth daily.   Vitamin D, Ergocalciferol, (DRISDOL) 1.25 MG (50000 UNIT)  CAPS capsule Take 50,000 Units by mouth every Thursday.     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  Tobacco Use   Smoking status: Never   Smokeless tobacco: Never  Vaping Use   Vaping status: Never Used  Substance and Sexual Activity   Alcohol use: Never   Drug use: Never   Sexual activity: Not Currently    Birth control/protection: None  Other Topics Concern   Not on file  Social History Narrative   Not on file   Social Drivers of Health   Financial Resource Strain: Not on file  Food Insecurity: Not on file  Transportation Needs: Not on file  Physical Activity: Not on file  Stress: Not on file  Social Connections: Not on file  Family History: The patient's family history includes Colon cancer in her brother; Heart disease in her sister; Hypertension in her mother, sister, and sister; Stroke in her mother, sister, and sister.  ROS:   Please see the history of present illness. All other systems reviewed and are negative.  EKGs/Labs/Other Studies Reviewed:    Coronary CTA  03/15/2022: IMPRESSION: 1. Total coronary calcium  score of 8.13. This was 91st percentile for age and sex matched control.   2. Normal coronary origin with left dominance.   3. Artifact: Moderate. (signal-to-noise (BMI 45)/ motion).   4. CAD-RADS = 2 Mild non-obstructive CAD.   Left main: Patent.   LAD: Patent.   LCX: Mild stenosis (25-49%) closer to 25% at mid LCx due to non calcified plaque.   RCA: Mild stenosis (25-49%) at proximal RCA due to eccentric noncalcified plaque.   5. Aortic atherosclerosis.   6. Main pulmonary artery mildly dilated  without proximal filling defect.   RECOMMENDATIONS: Consider non-atherosclerotic causes of chest pain. Consider preventive therapy and risk factor modification.   EKG:  EKG is personally reviewed. 09/21/2023: Not ordered.  Recent Labs: 09/23/2023: TSH 0.904 02/10/2024: BUN 18; Creatinine, Ser 0.97; Potassium 3.6; Sodium 138   Recent Lipid Panel No results found for: CHOL, TRIG, HDL, CHOLHDL, VLDL, LDLCALC, LDLDIRECT  Physical Exam:    VS:  BP 110/66   Pulse 79   Ht 5' 6 (1.676 m)   Wt 259 lb (117.5 kg)   SpO2 99%   BMI 41.80 kg/m  , BMI Body mass index is 41.8 kg/m. GENERAL:  Well appearing HEENT: Pupils equal round and reactive, fundi not visualized, oral mucosa unremarkable NECK:  No jugular venous distention, waveform within normal limits, carotid upstroke brisk and symmetric, no bruits, no thyromegaly LUNGS:  Clear to auscultation bilaterally HEART:  RRR.  PMI not displaced or sustained,S1 and S2 within normal limits, no S3, no S4, no clicks, no rubs, no murmurs ABD:  Flat, positive bowel sounds normal in frequency in pitch, no bruits, no rebound, no guarding, no midline pulsatile mass, no hepatomegaly, no splenomegaly EXT:  2 plus pulses throughout, no edema, no cyanosis no clubbing SKIN:  No rashes no nodules NEURO:  Cranial nerves II through XII grossly intact, motor grossly intact throughout PSYCH:  Cognitively intact, oriented to person place and time    ASSESSMENT/PLAN:    Assessment & Plan  # CAD:  # Hyperlipidemia Non-obstructive disease noted on coronary CT-A 03/2022.  91st percentile for age/gender.  Elevated cholesterol with statin intolerance.  She previously tried rosuvastatin , atorvastatin  and simvastaitn.  Also tried Zetia .   LDL in 90s, more plaque than expected for age. Requires aggressive management. - Discontinue Zetia  and rosuvastatin . - Initiate Repatha  (evolocumab ) injections biweekly. - Recheck lipid panel and comprehensive  metabolic panel in 2-3 months.  # HFpEF:  # Hypertension: Well-controlled with current medications. Occasional elevated readings, no recent diastolic in 90s. Procedural intervention discussed but not indicated due to insurance and adequate control. - Continue carvedilol , chlorthalidone , minoxidil , spironolactone , and valsartan .  We did discuss renal denervation and she is interested.  However do not recommend that we pursue that at this time given that her blood pressure is now well-controlled.  Encouraged her to continue with her regular exercise and sodium restriction.  She is bulimic on exam. - Monitor blood pressure biweekly.     Screening for Secondary Hypertension:     09/21/2023    3:18 PM  Causes  Drugs/Herbals Screened     -  Comments no caffeine or EtOH.  Occasional NSAIDS  Renovascular HTN Screened     - Comments MRI normla in 2008  Sleep Apnea Screened     - Comments not using CPAP regularly  Thyroid Disease Screened     - Comments check TSH  Hyperaldosteronism Screened     - Comments check renin and aldosterone  Pheochromocytoma N/A  Cushing's Syndrome Screened  Hyperparathyroidism N/A  Coarctation of the Aorta Screened     - Comments BP symmetric  Compliance Screened    Relevant Labs/Studies:    Latest Ref Rng & Units 02/10/2024    4:00 PM 09/23/2023   10:40 AM 06/12/2023    9:53 PM  Basic Labs  Sodium 134 - 144 mmol/L 138  137  135   Potassium 3.5 - 5.2 mmol/L 3.6  4.3  3.7   Creatinine 0.57 - 1.00 mg/dL 9.02  9.03       Disposition:    FU with APP/PharmD/MD in 6 months  Medication Adjustments/Labs and Tests Ordered: Current medicines are reviewed at length with the patient today.  Concerns regarding medicines are outlined above.   Orders Placed This Encounter  Procedures   Lipid panel   Comprehensive metabolic panel with GFR   EKG [id number]   Meds ordered this encounter  Medications   Evolocumab  (REPATHA  SURECLICK) 140 MG/ML SOAJ    Sig: Inject 140  mg into the skin every 14 (fourteen) days.    Dispense:  2 mL    Refill:  2    D/C ZETIA  AND CRESTOR      Signed, Annabella Scarce, MD  06/26/2024 8:30 AM    [facility]

## 2024-06-26 NOTE — Telephone Encounter (Signed)
 PA request has been Submitted. New Encounter has been or will be created for follow up. For additional info see Pharmacy Prior Auth telephone encounter from 06/26/24.

## 2024-06-26 NOTE — Telephone Encounter (Signed)
 Pharmacy Patient Advocate Encounter   Received notification from Physician's Office that prior authorization for REPATHA  is required/requested.   Insurance verification completed.   The patient is insured through [facility] .   Per test claim: PA required; PA submitted to above mentioned insurance via CoverMyMeds Key/confirmation #/EOC [id number] Status is pending

## 2024-06-26 NOTE — Telephone Encounter (Signed)
 Pharmacy Patient Advocate Encounter  Received notification from [facility] that Prior Authorization for REPATHA  has been APPROVED from 06/26/24 to 06/26/25. Ran test claim, Copay is $47. This test claim was processed through [facility]- copay amounts may vary at other pharmacies due to pharmacy/plan contracts, or as the patient moves through the different stages of their insurance plan.

## 2024-06-28 ENCOUNTER — Other Ambulatory Visit (HOSPITAL_BASED_OUTPATIENT_CLINIC_OR_DEPARTMENT_OTHER): Payer: Self-pay

## 2024-07-10 ENCOUNTER — Ambulatory Visit: Admitting: Pharmacist Clinician (PhC)/ Clinical Pharmacy Specialist

## 2024-07-31 ENCOUNTER — Other Ambulatory Visit (HOSPITAL_BASED_OUTPATIENT_CLINIC_OR_DEPARTMENT_OTHER): Payer: Self-pay | Admitting: Cardiovascular Disease

## 2024-09-23 ENCOUNTER — Other Ambulatory Visit (HOSPITAL_BASED_OUTPATIENT_CLINIC_OR_DEPARTMENT_OTHER): Payer: Self-pay | Admitting: Cardiovascular Disease

## 2024-10-09 ENCOUNTER — Encounter (HOSPITAL_BASED_OUTPATIENT_CLINIC_OR_DEPARTMENT_OTHER): Payer: Self-pay

## 2024-10-09 ENCOUNTER — Emergency Department (HOSPITAL_BASED_OUTPATIENT_CLINIC_OR_DEPARTMENT_OTHER)
Admission: EM | Admit: 2024-10-09 | Discharge: 2024-10-09 | Disposition: A | Discharge status: Home / Self Care | Attending: Emergency Medicine | Admitting: Emergency Medicine

## 2024-10-09 ENCOUNTER — Emergency Department (HOSPITAL_BASED_OUTPATIENT_CLINIC_OR_DEPARTMENT_OTHER)

## 2024-10-09 ENCOUNTER — Other Ambulatory Visit: Payer: Self-pay

## 2024-10-09 DIAGNOSIS — R10A1 Flank pain, right side: Secondary | ICD-10-CM | POA: Diagnosis not present

## 2024-10-09 DIAGNOSIS — M545 Low back pain, unspecified: Secondary | ICD-10-CM | POA: Insufficient documentation

## 2024-10-09 LAB — COMPREHENSIVE METABOLIC PANEL WITH GFR
ALT: 19 U/L (ref 0–44)
AST: 28 U/L (ref 15–41)
Albumin: 4.1 g/dL (ref 3.5–5.0)
Alkaline Phosphatase: 82 U/L (ref 38–126)
Anion gap: 11 (ref 5–15)
BUN: 17 mg/dL (ref 6–20)
CO2: 25 mmol/L (ref 22–32)
Calcium: 10.1 mg/dL (ref 8.9–10.3)
Chloride: 99 mmol/L (ref 98–111)
Creatinine, Ser: 1.25 mg/dL — ABNORMAL HIGH (ref 0.44–1.00)
GFR, Estimated: 52 mL/min — ABNORMAL LOW (ref >60)
Glucose, Bld: 90 mg/dL (ref 70–99)
Potassium: 4.3 mmol/L (ref 3.5–5.1)
Sodium: 134 mmol/L — ABNORMAL LOW (ref 135–145)
Total Bilirubin: 1.3 mg/dL — ABNORMAL HIGH (ref 0.0–1.2)
Total Protein: 8.1 g/dL (ref 6.5–8.1)

## 2024-10-09 LAB — CBC WITH DIFFERENTIAL/PLATELET
Abs Immature Granulocytes: 0.03 K/uL (ref 0.00–0.07)
Basophils Absolute: 0.1 K/uL (ref 0.0–0.1)
Basophils Relative: 1 %
Eosinophils Absolute: 0.2 K/uL (ref 0.0–0.5)
Eosinophils Relative: 2 %
HCT: 35.2 % — ABNORMAL LOW (ref 36.0–46.0)
Hemoglobin: 12.2 g/dL (ref 12.0–15.0)
Immature Granulocytes: 0 %
Lymphocytes Relative: 33 %
Lymphs Abs: 3.4 K/uL (ref 0.7–4.0)
MCH: 30.9 pg (ref 26.0–34.0)
MCHC: 34.7 g/dL (ref 30.0–36.0)
MCV: 89.1 fL (ref 80.0–100.0)
Monocytes Absolute: 0.7 K/uL (ref 0.1–1.0)
Monocytes Relative: 6 %
Neutro Abs: 6 K/uL (ref 1.7–7.7)
Neutrophils Relative %: 58 %
Platelets: 299 K/uL (ref 150–400)
RBC: 3.95 MIL/uL (ref 3.87–5.11)
RDW: 12.4 % (ref 11.5–15.5)
WBC: 10.3 K/uL (ref 4.0–10.5)
nRBC: 0 % (ref 0.0–0.2)

## 2024-10-09 LAB — URINALYSIS, ROUTINE W REFLEX MICROSCOPIC
Bilirubin Urine: NEGATIVE
Glucose, UA: NEGATIVE mg/dL
Hgb urine dipstick: NEGATIVE
Ketones, ur: NEGATIVE mg/dL
Leukocytes,Ua: NEGATIVE
Nitrite: NEGATIVE
Protein, ur: NEGATIVE mg/dL
Specific Gravity, Urine: 1.012 (ref 1.005–1.030)
pH: 5.5 (ref 5.0–8.0)

## 2024-10-09 MED ORDER — KETOROLAC TROMETHAMINE 15 MG/ML IJ SOLN
15.0000 mg | Freq: Once | INTRAMUSCULAR | Status: DC
  Filled 2024-10-09: qty 1

## 2024-10-09 MED ORDER — LIDOCAINE 5 % EX PTCH
1.0000 | MEDICATED_PATCH | CUTANEOUS | Status: DC
  Administered 2024-10-09: 1 via TRANSDERMAL
  Filled 2024-10-09: qty 1

## 2024-10-09 MED ORDER — LIDOCAINE 5 % EX PTCH: 1.0000 | MEDICATED_PATCH | CUTANEOUS | 0 refills | Status: AC

## 2024-10-09 MED ORDER — KETOROLAC TROMETHAMINE 15 MG/ML IJ SOLN
15.0000 mg | Freq: Once | INTRAMUSCULAR | Status: AC
  Administered 2024-10-09: 15 mg via INTRAVENOUS

## 2024-10-09 NOTE — ED Provider Notes (Signed)
 [place] EMERGENCY DEPARTMENT AT [facility] Provider Note   CSN: [id number] Arrival date & time: 10/09/24  1224     Patient presents with: Flank Pain   Jessica [name] is a 53 y.o. female presents today for right flank/lower back pain x 1 month.  Patient reports urinary urgency on Saturday.  Patient states that the pain is worse with movement.  Patient is concerned she may have passed a kidney stone.  Patient denies fever, chills, nausea, vomiting, abdominal pain, pain radiation, chest pain, or shortness of breath.    Flank Pain       Prior to Admission medications   Medication Sig Start Date End Date Taking? Authorizing Provider  lidocaine  (LIDODERM ) 5 % Place 1 patch onto the skin daily. Remove & Discard patch within 12 hours or as directed by MD 10/09/24  Yes Francis Ileana SAILOR, PA-C  albuterol  (VENTOLIN  HFA) 108 (90 Base) MCG/ACT inhaler Inhale 1-2 puffs into the lungs as needed for shortness of breath or wheezing. 01/22/22   [provider]  carvedilol  (COREG ) 12.5 MG tablet TAKE 1 TABLET(12.5 MG) BY MOUTH TWICE DAILY WITH A MEAL 02/09/22   Cantwell, Celeste C, PA-C  chlorthalidone  (HYGROTON ) 25 MG tablet Take 1 tablet (25 mg total) by mouth daily. 01/30/24 06/26/24  Raford Riggs, MD  Evolocumab  (REPATHA  SURECLICK) 140 MG/ML SOAJ INJECT 140 MG SUBCUTANEOUSLY EVERY 14 DAYS 09/27/24   Raford Riggs, MD  minoxidil  (LONITEN ) 2.5 MG tablet Take 1 tablet by mouth twice daily 08/01/24   Raford Riggs, MD  OVER THE COUNTER MEDICATION Take 1 each by mouth daily. Beets chews.    [provider]  Prenatal Vit-Fe Fumarate-FA (PRENATAL PO) Take 1 tablet by mouth daily.    [provider]  spironolactone  (ALDACTONE ) 50 MG tablet Take 50 mg by mouth 2 (two) times daily.    [provider]  valsartan  (DIOVAN ) 320 MG tablet Take 1 tablet by mouth once daily 09/26/24   Raford Riggs, MD  Vitamin D, Ergocalciferol, (DRISDOL) 1.25 MG (50000  UNIT) CAPS capsule Take 50,000 Units by mouth every Thursday. 05/11/23   [provider]    Allergies: Lipitor [atorvastatin ], Asa [aspirin], Dyazide [triamterene-hctz], Norvasc [amlodipine besylate], and Penicillins    Review of Systems  Genitourinary:  Positive for flank pain and urgency.  Musculoskeletal:  Positive for back pain.    Updated Vital Signs BP (!) 142/86 (BP Location: Right Arm)   Pulse 74   Temp 98.8 F (37.1 C) (Oral)   Resp 18   Ht 5' 5 (1.651 m)   Wt 113.4 kg   SpO2 100%   BMI 41.60 kg/m   Physical Exam Vitals and nursing note reviewed.  Constitutional:      General: She is not in acute distress.    Appearance: She is well-developed. She is obese. She is not toxic-appearing.  HENT:     Head: Normocephalic and atraumatic.     Right Ear: External ear normal.     Left Ear: External ear normal.  Eyes:     Conjunctiva/sclera: Conjunctivae normal.  Cardiovascular:     Rate and Rhythm: Normal rate and regular rhythm.     Pulses: Normal pulses.     Heart sounds: Normal heart sounds. No murmur heard. Pulmonary:     Effort: Pulmonary effort is normal. No respiratory distress.     Breath sounds: Normal breath sounds.  Abdominal:     Palpations: Abdomen is soft.     Tenderness: There is no  abdominal tenderness. There is no right CVA tenderness or left CVA tenderness.  Musculoskeletal:        General: No swelling.     Cervical back: Neck supple.     Comments: No bony tenderness to palpation along the T or lumbar spine.  Skin:    General: Skin is warm and dry.     Capillary Refill: Capillary refill takes less than 2 seconds.  Neurological:     General: No focal deficit present.     Mental Status: She is alert and oriented to person, place, and time.  Psychiatric:        Mood and Affect: Mood normal.     (all labs ordered are listed, but only abnormal results are displayed) Labs Reviewed  COMPREHENSIVE METABOLIC PANEL WITH GFR - Abnormal;  Notable for the following components:      Result Value   Sodium 134 (*)    Creatinine, Ser 1.25 (*)    Total Bilirubin 1.3 (*)    GFR, Estimated 52 (*)    All other components within normal limits  CBC WITH DIFFERENTIAL/PLATELET - Abnormal; Notable for the following components:   HCT 35.2 (*)    All other components within normal limits  URINALYSIS, ROUTINE W REFLEX MICROSCOPIC    EKG: None  Radiology: CT Renal Stone Study Result Date: 10/09/2024 CLINICAL DATA:  Abdominal/flank pain.  Concern for kidney stone. EXAM: CT ABDOMEN AND PELVIS WITHOUT CONTRAST TECHNIQUE: Multidetector CT imaging of the abdomen and pelvis was performed following the standard protocol without IV contrast. RADIATION DOSE REDUCTION: This exam was performed according to the departmental dose-optimization program which includes automated exposure control, adjustment of the mA and/or kV according to patient size and/or use of iterative reconstruction technique. COMPARISON:  None Available. FINDINGS: Evaluation of this exam is limited in the absence of intravenous contrast. Lower chest: The visualized lung bases are clear. No intra-abdominal free air.  Trace free fluid in the pelvis. Hepatobiliary: The liver is unremarkable. No biliary dilatation. Multiple gallstones. No pericholecystic fluid or evidence of acute cholecystitis by CT. Pancreas: Unremarkable. No pancreatic ductal dilatation or surrounding inflammatory changes. Spleen: Normal in size without focal abnormality. Adrenals/Urinary Tract: The adrenal glands unremarkable. The kidneys, visualized ureters, and urinary bladder appear unremarkable. Stomach/Bowel: Small hiatal hernia. There is sigmoid diverticulosis. There is moderate stool throughout the colon. There is no bowel obstruction or active inflammation. The appendix is normal. Vascular/Lymphatic: Mild aortoiliac atherosclerotic disease. The IVC is unremarkable. No portal venous gas. There is no adenopathy.  Reproductive: The uterus is anteverted no suspicious adnexal masses. Other: Small fat containing umbilical hernia. Musculoskeletal: Mild degenerative changes of the spine. No acute osseous pathology. IMPRESSION: 1. No acute intra-abdominal or pelvic pathology. No hydronephrosis or nephrolithiasis. 2. Cholelithiasis. 3. Sigmoid diverticulosis. No bowel obstruction. Normal appendix. 4.  Aortic Atherosclerosis (ICD10-I70.0). Electronically Signed   By: Vanetta Chou M.D.   On: 10/09/2024 18:39   CT L-SPINE NO CHARGE Result Date: 10/09/2024 CLINICAL DATA:  Back pain. EXAM: CT LUMBAR SPINE WITHOUT CONTRAST TECHNIQUE: Multidetector CT imaging of the lumbar spine was performed without intravenous contrast administration. Multiplanar CT image reconstructions were also generated. RADIATION DOSE REDUCTION: This exam was performed according to the departmental dose-optimization program which includes automated exposure control, adjustment of the mA and/or kV according to patient size and/or use of iterative reconstruction technique. COMPARISON:  CT abdomen pelvis dated 10/09/2024. FINDINGS: Segmentation: 5 lumbar type vertebrae. Alignment: Normal. Vertebrae: No acute fracture or focal pathologic process. Paraspinal and  other soft tissues: Negative. Disc levels: No acute findings. No significant degenerative changes. Mild anterior vertebral spurring primarily at L3-L4. IMPRESSION: No acute/traumatic lumbar spine pathology. Electronically Signed   By: Vanetta Chou M.D.   On: 10/09/2024 18:33     Procedures   Medications Ordered in the ED  ketorolac  (TORADOL ) 15 MG/ML injection 15 mg (has no administration in time range)  lidocaine  (LIDODERM ) 5 % 1 patch (has no administration in time range)                                    Medical Decision Making Amount and/or Complexity of Data Reviewed Labs: ordered. Radiology: ordered.   This patient presents to the ED for concern of right flank pain and  urinary urgency differential diagnosis includes UTI, nephrolithiasis, musculoskeletal pain, pyelonephritis, septic stone    Additional history obtained   Additional history obtained from Electronic Medical Record External records from outside source obtained and reviewed including cardiology notes   Lab Tests:  I Ordered, and personally interpreted labs.  The pertinent results include: UA unremarkable, CBC unremarkable, mildly elevated creatinine at 1.25 from a baseline of approximately 1.0, mildly reduced sodium at 134, mildly elevated total bili at 1.3   Imaging Studies ordered:  I ordered imaging studies including CT renal stone study and L-spine no charge I independently visualized and interpreted imaging which showed no acute intra-abdominal or pelvic pathology.  No acute /traumatic lumbar spine pathology I agree with the radiologist interpretation   Medicines ordered and prescription drug management:  I ordered medication including Toradol  and Lidoderm  patch    I have reviewed the patients home medicines and have made adjustments as needed   Problem List / ED Course:  Considered for admission further workup however patient's vital signs, physical exam, labs, and imaging are reassuring.  Patient's symptoms likely due to musculoskeletal pain.  Patient advised to alternate Tylenol  and Motrin  for pain.  Patient given Lidoderm  patches outpatient.  Patient to follow-up with primary care if her symptoms persist for further evaluation workup.  I feel patient safe for discharge at this time.      Final diagnoses:  Right-sided low back pain without sciatica, unspecified chronicity    ED Discharge Orders          Ordered    lidocaine  (LIDODERM ) 5 %  Every 24 hours        10/09/24 1918               Francis Ileana SAILOR, PA-C 10/09/24 1918    Dean Clarity, MD 10/09/24 2200

## 2024-10-09 NOTE — ED Notes (Signed)
 DC paperwork given and verbally understood.

## 2024-10-09 NOTE — ED Triage Notes (Signed)
 Right flank/lower back pain x1 month. Urinary urgency and drank palm juice on Saturday. Wonders if she passed a kidney stone Sunday. Denies nausea or vomiting. Pain worse with movement.

## 2024-10-09 NOTE — Discharge Instructions (Addendum)
 You were seen for back pain.  I suspect this is likely musculoskeletal in nature.  Your workup in the emergency department was reassuring.  You may alternate Tylenol  and Motrin  as needed for pain.  You have also been prescribed Lidoderm  patches that you can place for you have pain.  Thank you for letting us  treat you today. After reviewing your labs and imaging, I feel you are safe to go home. Please follow up with your PCP in the next several days and provide them with your records from this visit. Return to the Emergency Room if pain becomes severe or symptoms worsen.

## 2024-10-17 LAB — COMPREHENSIVE METABOLIC PANEL WITH GFR
ALT: 15 [IU]/L (ref 0–32)
AST: 22 [IU]/L (ref 0–40)
Albumin: 4.1 g/dL (ref 3.8–4.9)
Alkaline Phosphatase: 79 [IU]/L (ref 49–135)
BUN/Creatinine Ratio: 14 (ref 9–23)
BUN: 20 mg/dL (ref 6–24)
Bilirubin Total: 0.8 mg/dL (ref 0.0–1.2)
CO2: 22 mmol/L (ref 20–29)
Calcium: 9.4 mg/dL (ref 8.7–10.2)
Chloride: 102 mmol/L (ref 96–106)
Creatinine, Ser: 1.38 mg/dL — ABNORMAL HIGH (ref 0.57–1.00)
Globulin, Total: 3.4 g/dL (ref 1.5–4.5)
Glucose: 89 mg/dL (ref 70–99)
Potassium: 4.3 mmol/L (ref 3.5–5.2)
Sodium: 139 mmol/L (ref 134–144)
Total Protein: 7.5 g/dL (ref 6.0–8.5)
eGFR: 46 mL/min/{1.73_m2} — ABNORMAL LOW

## 2024-10-17 LAB — LIPID PANEL
Chol/HDL Ratio: 3.3 ratio (ref 0.0–4.4)
Cholesterol, Total: 125 mg/dL (ref 100–199)
HDL: 38 mg/dL — ABNORMAL LOW (ref >39)
LDL Chol Calc (NIH): 73 mg/dL (ref 0–99)
Triglycerides: 64 mg/dL (ref 0–149)
VLDL Cholesterol Cal: 14 mg/dL (ref 5–40)

## 2024-11-03 ENCOUNTER — Ambulatory Visit: Payer: Self-pay | Admitting: Cardiovascular Disease

## 2024-11-03 DIAGNOSIS — Z5181 Encounter for therapeutic drug level monitoring: Secondary | ICD-10-CM

## 2024-11-03 DIAGNOSIS — N289 Disorder of kidney and ureter, unspecified: Secondary | ICD-10-CM

## 2024-11-14 ENCOUNTER — Encounter (HOSPITAL_BASED_OUTPATIENT_CLINIC_OR_DEPARTMENT_OTHER): Payer: Self-pay | Admitting: Cardiovascular Disease

## 2024-12-07 LAB — BASIC METABOLIC PANEL WITH GFR
BUN/Creatinine Ratio: 15 (ref 9–23)
BUN: 21 mg/dL (ref 6–24)
CO2: 23 mmol/L (ref 20–29)
Calcium: 9.7 mg/dL (ref 8.7–10.2)
Chloride: 102 mmol/L (ref 96–106)
Creatinine, Ser: 1.42 mg/dL — ABNORMAL HIGH (ref 0.57–1.00)
Glucose: 91 mg/dL (ref 70–99)
Potassium: 4 mmol/L (ref 3.5–5.2)
Sodium: 138 mmol/L (ref 134–144)
eGFR: 45 mL/min/1.73 — ABNORMAL LOW

## 2024-12-11 MED ORDER — SPIRONOLACTONE 25 MG PO TABS: 25.0000 mg | ORAL_TABLET | Freq: Every day | ORAL | 3 refills | Status: AC

## 2024-12-11 MED ORDER — CARVEDILOL 25 MG PO TABS: 25.0000 mg | ORAL_TABLET | Freq: Two times a day (BID) | ORAL | 3 refills | Status: AC

## 2024-12-11 NOTE — Telephone Encounter (Signed)
 Pt returned call - please advise

## 2024-12-12 ENCOUNTER — Telehealth (HOSPITAL_BASED_OUTPATIENT_CLINIC_OR_DEPARTMENT_OTHER): Payer: Self-pay | Admitting: *Deleted

## 2024-12-12 NOTE — Telephone Encounter (Signed)
 Spoke with patient regarding labs  She is concerned about her kidneys and wanted to know if ok to try hammer tea   Will forward to Pharm D for review   Ok to send [facility] group response

## 2024-12-18 ENCOUNTER — Telehealth: Payer: Self-pay | Admitting: Cardiovascular Disease

## 2024-12-18 NOTE — Telephone Encounter (Signed)
 Pt c/o medication issue:  1. Name of Medication: Unsure which medication  2. How are you currently taking this medication (dosage and times per day)?     3. Are you having a reaction (difficulty breathing--STAT)?  Pain in right side  4. What is your medication issue?    Patient stated she is not sure which medication is causing her to have kidney issue and wants a call back to discuss her medications and other options.  Patient noted she has been taking her medications as prescribed.

## 2024-12-18 NOTE — Telephone Encounter (Signed)
 [name], Jessica, [facility] to Me     12/13/24 11:34 AM Should be fine to try  Mychart message sent to patient

## 2024-12-18 NOTE — Telephone Encounter (Signed)
 Called patient.  She had also called to ask about trying a certain tea to help matters, which she got the ok to take from Pharmd in previous encounter from today.  She is fine waiting to have labs as previously planned for now.  Will call if any other needs arise before next appointment.

## 2024-12-18 NOTE — Telephone Encounter (Signed)
 She was advised after last labs 12/08/24 to reduce Spironolactone  to 25mg  daily, increase carvedilol  to 25mg  BID as renal function was moving in wrong direction. Medication adjustments have already been made to help kidney function as of 12/12/24 when she was contacted regarding results. She was advised to follow up in HTN clinic in a couple months with BMP one week prior to that visit.   Recommend she increase hydration to help her kidneys and follow a low sodium diet.  If she would like to do repeat labs sooner than her April appointment, she may have them done 12/26/24 or after. Would not recommend having sooner than 12/26/24 as takes time for medication adjustments to take effect.  Jessica [name] S Eustace Hur, NP

## 2025-01-14 ENCOUNTER — Ambulatory Visit: Payer: 59 | Admitting: Adult Health

## 2025-02-05 ENCOUNTER — Ambulatory Visit: Admitting: Adult Health

## 2025-02-20 ENCOUNTER — Encounter (HOSPITAL_BASED_OUTPATIENT_CLINIC_OR_DEPARTMENT_OTHER): Admitting: Cardiovascular Disease

## 2025-03-07 ENCOUNTER — Encounter (HOSPITAL_BASED_OUTPATIENT_CLINIC_OR_DEPARTMENT_OTHER): Admitting: Family
